# Patient Record
Sex: Female | Born: 1965 | Race: Black or African American | Hispanic: No | Marital: Married
Health system: Midwestern US, Community
[De-identification: ages and names within clinical notes are randomized; demographics above are authoritative.]

## PROBLEM LIST (undated history)

## (undated) DIAGNOSIS — H547 Unspecified visual loss: Secondary | ICD-10-CM

## (undated) DIAGNOSIS — J45909 Unspecified asthma, uncomplicated: Secondary | ICD-10-CM

## (undated) DIAGNOSIS — I10 Essential (primary) hypertension: Secondary | ICD-10-CM

## (undated) DIAGNOSIS — Z1231 Encounter for screening mammogram for malignant neoplasm of breast: Secondary | ICD-10-CM

## (undated) DIAGNOSIS — M7542 Impingement syndrome of left shoulder: Secondary | ICD-10-CM

## (undated) DIAGNOSIS — M4800 Spinal stenosis, site unspecified: Secondary | ICD-10-CM

## (undated) DIAGNOSIS — G8918 Other acute postprocedural pain: Secondary | ICD-10-CM

## (undated) DIAGNOSIS — J4520 Mild intermittent asthma, uncomplicated: Secondary | ICD-10-CM

---

## 2009-06-15 LAB — CBC
Hematocrit: 34.2 % (ref 34.0–48.0)
Hemoglobin: 11.4 g/dL — ABNORMAL LOW (ref 11.5–15.5)
MCH: 30.9 pg (ref 26.0–35.0)
MCHC: 33.4 % (ref 32.0–34.5)
MCV: 92.4 fL (ref 80.0–99.9)
MPV: 7 fL (ref 7.0–12.0)
Platelets: 284 E9/L (ref 130–450)
RBC: 3.7 E12/L (ref 3.50–5.50)
RDW: 13.9 fL (ref 11.5–15.0)
WBC: 7.1 E9/L (ref 4.5–11.5)

## 2009-06-15 LAB — COMPREHENSIVE METABOLIC PANEL
ALT: 18 U/L (ref 10–49)
AST: 21 U/L (ref 0–33)
Albumin: 3.9 g/dL (ref 3.2–4.8)
Alkaline Phosphatase: 60 U/L (ref 45–129)
BUN: 14 mg/dL (ref 6–20)
CO2: 23 mmol/L (ref 20–31)
Calcium: 9 mg/dL (ref 8.6–10.5)
Chloride: 110 mmol/L — ABNORMAL HIGH (ref 99–109)
Creatinine: 1 mg/dL (ref 0.5–1.1)
Glucose: 86 mg/dL (ref 70–110)
Potassium: 3.5 mmol/L (ref 3.5–5.5)
Sodium: 139 mmol/L (ref 132–146)
Total Bilirubin: 0.4 mg/dL (ref 0.3–1.2)
Total Protein: 7.2 g/dL (ref 5.7–8.2)

## 2009-06-15 LAB — GFR GLOMERULAR FILTRATION RATE - HB: Gfr Calculated: >60 mL/min/{1.73_m2} (ref >=60)

## 2009-06-15 LAB — TSH: TSH: 2.057 u[IU]/mL (ref 0.350–5.000)

## 2009-08-04 MED ADMIN — leuprolide (LUPRON) injection 3.75 mg: 3.75 mg | INTRAMUSCULAR | @ 16:00:00 | NDC 00074364103

## 2009-08-04 NOTE — Progress Notes (Signed)
For Lupron #2,  No bleeding since started the lupron.  No hot flushes.    Uterus -- still about 17-18 weeks size.

## 2009-09-09 LAB — CBC WITH DIFFERENTIAL
Basophils %: 0 % (ref 0–2)
Basophils Absolute: 0.02 E9/L (ref 0.00–0.20)
Eosinophils %: 2 % (ref 0–6)
Eosinophils Absolute: 0.16 E9/L (ref 0.05–0.50)
Hematocrit: 38.6 % (ref 34.0–48.0)
Hemoglobin: 12.6 g/dL (ref 11.5–15.5)
Lymphocytes Absolute: 2.52 E9/L (ref 1.50–4.00)
Lymphocytes: 31 % (ref 20–42)
MCH: 29.7 pg (ref 26.0–35.0)
MCHC: 32.8 % (ref 32.0–34.5)
MCV: 90.7 fL (ref 80.0–99.9)
MPV: 7.3 fL (ref 7.0–12.0)
Monocytes %: 7 % (ref 2–12)
Monocytes Absolute: 0.58 E9/L (ref 0.10–0.95)
Neutrophils Absolute: 4.99 E9/L (ref 1.80–7.30)
Platelets: 342 E9/L (ref 130–450)
RBC: 4.25 E12/L (ref 3.50–5.50)
RDW: 12.9 fL (ref 11.5–15.0)
Seg Neutrophils: 60 % (ref 43–80)
WBC: 8.3 E9/L (ref 4.5–11.5)

## 2009-09-09 MED ADMIN — leuprolide (LUPRON) injection 3.75 mg: 3.75 mg | INTRAMUSCULAR | @ 16:00:00 | NDC 00074364103

## 2009-09-09 NOTE — Progress Notes (Signed)
lupron inj given pt tolerated well.

## 2009-09-09 NOTE — Progress Notes (Signed)
Here for Lupron #3.  No bleeding, taking her Iron, told to decrease it to 2 times a day.    Abdomen:  Uterus about 17 weeks size.    Imp fibroids, anemia    Plan:  Lupron #3 today, cbc today see 4 weeks and if count is up schedule surgery, TAH

## 2009-10-07 MED ADMIN — leuprolide (LUPRON) injection 3.75 mg: 3.75 mg | INTRAMUSCULAR | @ 19:00:00 | NDC 00074364103

## 2009-10-07 NOTE — Progress Notes (Signed)
lupron depot 3.75 mg given im per dr. Dorene Grebe

## 2009-10-07 NOTE — Progress Notes (Signed)
Tah scheduled for 10/26/08 at 0730 by Ruthie

## 2009-10-07 NOTE — Progress Notes (Signed)
Here for Lupron and possible surgery scheduling.  Hg 12+ last visit.    On examination, uterus remains about 17-18 weeks size.  Really need to proceed with surgery scheduling.  Discussed TAH try to preserve ovaries.    Will need medical clearence by [facility] for her hypertensin and low eyesight.    Proceed with Lupron today and schedule surgery and medical clearence.

## 2009-10-18 NOTE — Progress Notes (Signed)
For hysterectomy     Hx HTN and pseudotumor cerebri     No current problems    ROS neg    VS stable      HEENT WNL     No bruits    Heart regular    Lungs clear    abd non-tender    Extremities normal      HTN controlled    Low risk for surgery    Attending Physician Statement  I have discussed the case, including pertinent history and exam findings with the resident. I agree with the documented assessment and plan.

## 2009-10-18 NOTE — Progress Notes (Signed)
Subjective:      Patient ID: Andrea Pitts is a 44 y.o. female.    HPI Patient presents today for medical cleaarnce prior to surgery.  She is currently scheduled for hysterectomy per Dr Dorene Grebe secondary  to fibroids.  She does have a history of HTN and is currently on on several medication with which she is compliant.  She does monitor her BP at home and its is on average around 120/80's.  She also has a history of pseudotumour cerebri and is followed by her opthalmologist.  Review of Systems   Constitutional: Negative for fever and chills.   Respiratory: Negative for cough, chest tightness, shortness of breath and wheezing.    Cardiovascular: Negative for chest pain and palpitations.   Gastrointestinal: Positive for abdominal pain. Negative for nausea, vomiting and diarrhea.   Genitourinary: Positive for vaginal bleeding. Negative for dysuria, urgency and frequency.   Neurological: Negative for dizziness, syncope, weakness and numbness.       Objective:   Physical Exam   Constitutional: She is oriented to person, place, and time. She appears well-developed and well-nourished.   HENT:   Head: Normocephalic and atraumatic.   Mouth/Throat: Oropharynx is clear and moist. No oropharyngeal exudate.   Neck: Neck supple. No thyromegaly present.   Cardiovascular: Normal rate, regular rhythm, normal heart sounds and intact distal pulses.  Exam reveals no gallop and no friction rub.    No murmur heard.  Pulmonary/Chest: Effort normal and breath sounds normal. No respiratory distress. She has no wheezes. She has no rales.   Abdominal: Soft. Bowel sounds are normal. She exhibits no distension. No tenderness. She has no rebound and no guarding.   Lymphadenopathy:     She has no cervical adenopathy.   Neurological: She is alert and oriented to person, place, and time.   Skin: Skin is warm and dry.   Psychiatric: She has a normal mood and affect.       Assessment:    1.HTN  2.Uterine fibroids      Plan:    1.Continiue  current medications.  2.Low risk for surgery

## 2009-10-19 NOTE — Progress Notes (Signed)
Here for preop visit today.  TAH reviewed with patient.  Try to preserve ovaries.  Discussed the procedure at length, risks, etc.  All questions answered.  Mechanical bowel prep.    Has her medical clearance, from [facility]    Upper Pe:  WNL Except is overweight.  May need midline incision.    IMP: Mennorrhagia, fibroids    Plan:  TAH 10/26/2009

## 2009-10-20 LAB — CBC
Hematocrit: 38.9 % (ref 34.0–48.0)
Hemoglobin: 12.8 g/dL (ref 11.5–15.5)
MCH: 29.3 pg (ref 26.0–35.0)
MCHC: 33 % (ref 32.0–34.5)
MCV: 88.8 fL (ref 80.0–99.9)
MPV: 7.1 fL (ref 7.0–12.0)
Platelets: 314 E9/L (ref 130–450)
RBC: 4.39 E12/L (ref 3.50–5.50)
RDW: 13.7 fL (ref 11.5–15.0)
WBC: 7.8 E9/L (ref 4.5–11.5)

## 2009-10-20 LAB — ELECTROLYTE PANEL
CO2: 23 mmol/L (ref 20–31)
Chloride: 109 mmol/L (ref 99–109)
Potassium: 3.3 mmol/L — ABNORMAL LOW (ref 3.5–5.5)
Sodium: 138 mmol/L (ref 132–146)

## 2009-10-26 ENCOUNTER — Inpatient Hospital Stay: Admit: 2009-10-26 | Source: Home / Self Care | Attending: Obstetrics & Gynecology | Admitting: Obstetrics & Gynecology

## 2009-10-27 LAB — HEMOGLOBIN AND HEMATOCRIT
Hematocrit: 32.2 % — ABNORMAL LOW (ref 34.0–48.0)
Hematocrit: 33.3 % — ABNORMAL LOW (ref 34.0–48.0)
Hemoglobin: 10.7 g/dL — ABNORMAL LOW (ref 11.5–15.5)
Hemoglobin: 10.9 g/dL — ABNORMAL LOW (ref 11.5–15.5)

## 2009-10-28 LAB — CBC
Hematocrit: 32.7 % — ABNORMAL LOW (ref 34.0–48.0)
Hemoglobin: 10.9 g/dL — ABNORMAL LOW (ref 11.5–15.5)
MCH: 29.5 pg (ref 26.0–35.0)
MCHC: 33.3 % (ref 32.0–34.5)
MCV: 88.6 fL (ref 80.0–99.9)
MPV: 7 fL (ref 7.0–12.0)
Platelets: 268 E9/L (ref 130–450)
RBC: 3.69 E12/L (ref 3.50–5.50)
RDW: 14.1 fL (ref 11.5–15.0)
WBC: 10.9 E9/L (ref 4.5–11.5)

## 2009-10-28 LAB — BASIC METABOLIC PANEL
BUN: 10 mg/dL (ref 6–20)
CO2: 27 mmol/L (ref 20–31)
Calcium: 8.6 mg/dL (ref 8.6–10.5)
Chloride: 108 mmol/L (ref 99–109)
Creatinine: 1 mg/dL (ref 0.5–1.1)
Glucose: 79 mg/dL (ref 70–110)
Potassium: 3.7 mmol/L (ref 3.5–5.5)
Sodium: 140 mmol/L (ref 132–146)

## 2009-10-28 LAB — GFR CALCULATED: Gfr Calculated: >60 mL/min/{1.73_m2} (ref >=60)

## 2009-11-02 ENCOUNTER — Encounter

## 2009-11-02 NOTE — Telephone Encounter (Signed)
Pt post op requesting refill on Percocet

## 2009-11-04 NOTE — Progress Notes (Signed)
Post op Hysterrectomy, Bowels are fine.  Seems to be doing pretty well.    Wound, slight erythema, 1/2/ of staples removed and steri-strips place.  Rx: Cipro 500 bib, recheck Tuesday next week.    Peroxide and water to incision tid.

## 2009-11-10 NOTE — Progress Notes (Signed)
Wound healing with a small skin separation about mid area, but very clean and granulating well.  Remainder of staples removed and replaced steri-strips.  Continue peroxide and water 2-3 times a day.  RTC 1 wek.prescription for motrin 600 for dis comfort

## 2009-11-25 NOTE — Progress Notes (Signed)
Here for a wound check.  Feels well.    Wound, separation is granulating in well.  Very clean, no signs of infection.    Continue Peroxide/water to incision bid and recheck in 2 weeks

## 2009-12-09 NOTE — Progress Notes (Signed)
Wound doing well.  Nice and clean, granulating well.  Continue peroxide/water twice a day.  See 1 month for wound check and pelvic

## 2010-01-07 NOTE — Progress Notes (Signed)
Here for post op check.  Hyst 10/26/09, appears to be doing well.      Wound:Completely healed now    Pelvic:  Vault healed, bimanual clear.    RTC 1 year

## 2010-07-01 MED ORDER — ACETAZOLAMIDE 250 MG PO TABS
250 MG | ORAL_TABLET | Freq: Four times a day (QID) | ORAL | Status: DC
Start: 2010-07-01 — End: 2010-09-22

## 2010-07-01 NOTE — Progress Notes (Signed)
S: 44 year old female for follow up of asthma and HTN, pseudotumor cerebri.  Taking medications compliantly.  No HA, no blurred vision, no CP, no SOB.  Using albuterol infrequently, once per week at most, controlled.  Due for mammogram.  Declines influenza vaccine.  Follows with ophthalmology.    O: VS BP 110/70   Pulse 88   Temp(Src) 98.4 ??F (36.9 ??C) (Oral)   Resp 20   Ht 5\' 5"  (1.651 m)   Wt 298 lb (135.172 kg)   BMI 49.59 kg/m2   LMP 06/04/2009   HENT okay   Lungs CTAB, no wheezing   Heart regular, no murmur   Ext no edema, pulses intact  Impression: HTN, stable.  Asthma, controlled.    Plan: Refill current medications.  Fasting labs, lipids.  Offer immunizations, mammogram.  Has had hysterectomy and no longer gets Pap tests.  Follow up in 3 months or sooner prn.    Attending Physician Statement  I have discussed the case, including pertinent history and exam findings with the resident. I agree with the documented assessment and plan.

## 2010-07-01 NOTE — Progress Notes (Signed)
Subjective:      Patient ID: Andrea Pitts is a 44 y.o. female.    HPI This is a 44 yr old AAF is here for the follow up of HTN, Asthma, pseudotumor cerebri. She is taking medications,   HTN--She denies headache, blurred vision. She has decreased vision in both eyes from Pseudotumor cerebri. Denies chest pain, SOB, palpitations. Taking all the meds  Asthma--stable and she said just uses albuterol as needed.  C/o leaking of urine when she coughs, or sneezes, no dysuria/hematuria. Denies frequency      Review of Systems. General ROS: negative for - chills or fever  Ophthalmic ROS: negative for - blurry vision  Respiratory ROS: no cough, shortness of breath, or wheezing  Cardiovascular ROS: no chest pain or dyspnea on exertion  Genito-Urinary ROS: no dysuria, trouble voiding, or hematuria  Neurological ROS: negative for - numbness/tingling      Objective:   Physical Exam.BP 110/70   Pulse 88   Temp(Src) 98.4 ??F (36.9 ??C) (Oral)   Resp 20   Ht 5\' 5"  (1.651 m)   Wt 298 lb (135.172 kg)   BMI 49.59 kg/m2   LMP 06/04/2009   General appearance - alert, well appearing, and in no distress and oriented to person, place, and time  Eyes - pupils equal and reactive, extraocular eye movements intact  Ears - bilateral TM's and external ear canals normal  Neck - supple, no significant adenopathy  Chest - clear to auscultation, no wheezes, rales or rhonchi, symmetric air entry  Heart - normal rate, regular rhythm, normal S1, S2, no murmurs, rubs, clicks or gallops  Abdomen - soft, nontender, nondistended, no masses or organomegaly, no CVA tenderness  Musculoskeletal - no joint tenderness, deformity or swelling  Extremities - peripheral pulses normal, no pedal edema, no clubbing or cyanosis      Assessment:      HTN -stable  Asthma--stable  Stress incontinence  Health Maintainence      Plan:      Continue same meds  Ordered fasting blood work  Refilled all meds  For stress incontinence--given pamphlet with Kegel exercises. Held  his Lasix and will follow up BPs  Refused Flu shot  Will order mammogram  Doesn't need pap smear

## 2010-07-11 NOTE — Telephone Encounter (Signed)
Pt called in she needs the hydrochlorathiazide pills called back in to pharmacy in capsul form she cant cut the tablets and she is also self pay and can not afford the price difference between the 2 different forms  Call rx to [facility] on [address] pt can be reached at number provided

## 2010-07-15 LAB — CBC
HCT: 38.9 % (ref 34.0–48.0)
HGB: 13.2 g/dL (ref 11.5–15.5)
MCH: 30.6 pg (ref 26.0–35.0)
MCHC: 33.8 % (ref 32.0–34.5)
MCV: 90.5 fL (ref 80.0–99.9)
MPV: 7.1 fL (ref 7.0–12.0)
Platelets: 330 E9/L (ref 130–450)
RBC: 4.3 E12/L (ref 3.50–5.50)
RDW: 13.4 fL (ref 11.5–15.0)
WBC: 9.9 E9/L (ref 4.5–11.5)

## 2010-07-15 LAB — COMPREHENSIVE METABOLIC PANEL
ALT: 21 U/L (ref 10–49)
AST: 24 U/L (ref 0–33)
Albumin: 4.3 g/dL (ref 3.2–4.8)
Alkaline Phosphatase: 72 U/L (ref 45–129)
BUN: 19 mg/dL (ref 6–20)
Bilirubin, Total: 0.4 mg/dL (ref 0.3–1.2)
CO2: 30 mmol/L (ref 20–31)
Calcium: 9.7 mg/dL (ref 8.6–10.5)
Chloride: 109 mmol/L (ref 99–109)
Creatinine: 1 mg/dL (ref 0.5–1.1)
Glucose: 85 mg/dL (ref 70–110)
Potassium: 4.3 mmol/L (ref 3.5–5.5)
Sodium: 139 mmol/L (ref 132–146)
Total Protein: 7.8 g/dL (ref 5.7–8.2)

## 2010-07-15 LAB — TSH: TSH: 3.539 u[IU]/mL (ref 0.350–5.000)

## 2010-07-15 LAB — LIPID PANEL
Cholesterol: 212 mg/dL — ABNORMAL HIGH (ref 0–199)
HDL: 45 mg/dL (ref >40.0)
LDL Calculated: 140 mg/dL — ABNORMAL HIGH (ref 0–99)
Triglycerides: 136 mg/dL (ref 0–149)

## 2010-07-15 LAB — GFR CALCULATED: Gfr Calculated: 60 mL/min/{1.73_m2} (ref >=60)

## 2010-07-15 NOTE — Telephone Encounter (Signed)
Patient blood pressure was 110/80.

## 2010-09-21 NOTE — Telephone Encounter (Signed)
Pt scheduled with bhoopathy but no openings until beginning of feb. However she is out of meds: hctz capsules, lisinopril and diamox. Could meds be called in? Diamox goes to [facility] on [address] and the other 2 get [facility] into [facility] in [place]. Her new number is [phone] and was changed in epic under home number. Thank you.

## 2010-09-22 MED ORDER — ACETAZOLAMIDE 250 MG PO TABS
250 MG | ORAL_TABLET | Freq: Four times a day (QID) | ORAL | Status: DC
Start: 2010-09-22 — End: 2010-10-07

## 2010-09-22 MED ORDER — HYDROCHLOROTHIAZIDE 25 MG PO TABS
25 MG | ORAL_TABLET | Freq: Every day | ORAL | Status: DC
Start: 2010-09-22 — End: 2010-10-07

## 2010-09-22 MED ORDER — LISINOPRIL 10 MG PO TABS
10 MG | ORAL_TABLET | Freq: Every day | ORAL | Status: DC
Start: 2010-09-22 — End: 2010-10-07

## 2010-10-07 MED ORDER — HYDROCHLOROTHIAZIDE 12.5 MG PO CAPS
12.5 MG | ORAL_CAPSULE | Freq: Every day | ORAL | Status: DC
Start: 2010-10-07 — End: 2011-01-12

## 2010-10-07 MED ORDER — ACETAZOLAMIDE 250 MG PO TABS
250 MG | ORAL_TABLET | Freq: Four times a day (QID) | ORAL | Status: DC
Start: 2010-10-07 — End: 2011-01-12

## 2010-10-07 MED ORDER — LISINOPRIL 10 MG PO TABS
10 MG | ORAL_TABLET | Freq: Every day | ORAL | Status: DC
Start: 2010-10-07 — End: 2011-01-12

## 2010-10-07 NOTE — Progress Notes (Signed)
Subjective:      Patient ID: Andrea Pitts is a 45 y.o. female.    HPIThis is a 45 yr old AAF is here for the folow up of HTN. She denie sheadaceh, no recent vision changes, denies chest pain, SOB.  She is taking all her medications  She has H/o Pseudotumor cerebri--follow Ophtholomologist and is on Diamox  Asthma-stable  HM-Upto date for Mammogram  Had Hysterectomy for abnormal bleeding    Review of Systems.Review of Systems - General ROS: negative for - chills or fever  Ophthalmic ROS: negative for - recent changes but she has base line blurred vision secondary to Pseudotumor cerebri  Respiratory ROS: no cough, shortness of breath, or wheezing  Cardiovascular ROS: no chest pain or dyspnea on exertion  Extremities--No swelling    Objective:   Physical Exam  .BP 130/84   Pulse 78   Temp(Src) 98.3 ??F (36.8 ??C) (Oral)   Resp 16   Wt 302 lb (136.986 kg)   LMP 06/04/2009  .Physical Examination: General appearance - alert, well appearing, and in no distress and oriented to person, place, and time  Chest - clear to auscultation, no wheezes, rales or rhonchi, symmetric air entry  Heart - normal rate, regular rhythm, normal S1, S2, no murmurs, rubs, clicks or gallops  Neurological - grossly intact  Extremities - peripheral pulses normal, no pedal edema, no clubbing or cyanosis      Assessment:      HTN  Asthma  Pseudotumor cerebri  Health maintainence      Plan:      Continue the same meds  Refilled her medications  Advised to keep the appointments with Eye doctor  HM-she refused flu and TdaP shot  Follow up in 3 months

## 2010-10-07 NOTE — Progress Notes (Signed)
S: 45 year old female for follow up HTN and asthma.  Compliant with medications.  No new visual changes, no headaches.  Asthma is stable.  No recent exacerbations.  Has had mammogram.  Had Pap at [facility], s/p hyster for menorrhagia.  Sees ophthalmology with pseudotumor cerebri.    O: VS BP 130/84   Pulse 78   Temp(Src) 98.3 ??F (36.8 ??C) (Oral)   Resp 16   Wt 302 lb (136.986 kg)   LMP 06/04/2009   Heart regular   Lungs CTAB   Abd soft, nontender, obese    MS intact, no edema  Impression: HTN, asthma, controlled  Plan: Continue same medications, refills provided.  Patient declines immunizations.  Follow up with ophthalmology.  RTO 3 months or sooner prn.    Attending Physician Statement  I have discussed the case, including pertinent history and exam findings with the resident. I agree with the documented assessment and plan.

## 2011-01-12 MED ORDER — IBUPROFEN 800 MG PO TABS
800 MG | ORAL_TABLET | Freq: Three times a day (TID) | ORAL | Status: DC | PRN
Start: 2011-01-12 — End: 2012-01-07

## 2011-01-12 MED ORDER — LISINOPRIL 10 MG PO TABS
10 MG | ORAL_TABLET | Freq: Every day | ORAL | Status: DC
Start: 2011-01-12 — End: 2011-04-05

## 2011-01-12 MED ORDER — HYDROCHLOROTHIAZIDE 12.5 MG PO CAPS
12.5 MG | ORAL_CAPSULE | Freq: Every day | ORAL | Status: DC
Start: 2011-01-12 — End: 2011-04-05

## 2011-01-12 MED ORDER — ACETAZOLAMIDE 250 MG PO TABS
250 MG | ORAL_TABLET | Freq: Four times a day (QID) | ORAL | Status: DC
Start: 2011-01-12 — End: 2011-04-11

## 2011-01-12 NOTE — Progress Notes (Signed)
S: this is a 45 yr old AAF is here for the b/l knee pain, right shoulder pain for 2 months  Knee pain--intermittent, sharp, worsens on exertion, 5/10 intensity, sometimes worsens even on rest, disturbing sleep, no weakness, numbness/tngling.   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  H/o Pseudotumor cerebri, has visual  loss from it.    O: .BP 124/78   Pulse 80   Temp(Src) 98.6 ??F (37 ??C) (Oral)   Resp 20   Ht 5\' 6"  (1.676 m)   Wt 304 lb (137.893 kg)   BMI 49.07 kg/m2   LMP 06/04/2009  .Physical Examination:   General appearance - alert, well appearing,obese and in no distress and oriented to person, place, and time  Neck - supple, no significant adenopathy  Chest - clear to auscultation, no wheezes, rales or rhonchi, symmetric air entry  Heart - normal rate, regular rhythm, normal S1, S2, no murmurs, rubs, clicks or gallops  Abdomen - soft, nontender, nondistended, no masses or organomegaly  Musculoskeletal -B/L  knee-crepitus, ROM is normal, DTR normal,no swelling, Anterior and posterior drawer tests are normal  Right shoulder- tenderness in joint line, ROM is decreased ,  Strength is normal. Sensations intact.   Extremities - peripheral pulses normal, no pedal edema, no clubbing or cyanosis    A/P    1. B/l knee pain   X-ray of knees   Ibuprofen as needed   Encouraged heating pad   Referred to PT  2. Right shoulder pain   X-ray of shoulder   Supportive treatment   Referral to PT   Consider injection in future visit  .3. Obesity   Advised lifestyle changes about healthy diet and exercise

## 2011-01-12 NOTE — Progress Notes (Signed)
S: 45 y.o. female with knee pain, bilateral, for about 2 months.  Right shoulder pain.  Sore, spasms at times in knees.  Pain awakes her at night.  Intermittent, 5/10, sometimes worse with activity.  No numbness or weakness.  No tingling.  Right shoulder joint sore, bothers her in bed.  History of pseudotumor cerebri, chronic visual problems, nothing new.   O: VS: BP 124/78   Pulse 80   Temp(Src) 98.6 ??F (37 ??C) (Oral)   Resp 20   Ht 5\' 6"  (1.676 m)   Wt 304 lb (137.893 kg)   BMI 49.07 kg/m2   LMP 06/04/2009   General: NAD, appropriate affect and grooming   Right shoulder tenderness along joint, ROM decreased, no weakness, no atrophy.  MS intact distally, sensation intact.     Knees with crepitus, extension and flexion normal ROM.  No laxity.     CV:  RRR, no gallops, rubs, or murmurs   Resp: CTAB   Ext:  No edema  Impression: Bilateral knee pain, right shoulder pain  Plan: X-rays bilateral knee, weight bearing views.  Shoulder x-rays, PT.  NSAIDs.  Counsel on lifestyle interventions, healthy diet and exercise.  RTO 4-6 weeks or sooner prn. Consider injection in the future if no better.     Attending Physician Statement  I have discussed the case, including pertinent history and exam findings with the resident. I agree with the documented assessment and plan.

## 2011-01-19 NOTE — Progress Notes (Signed)
[facility]                Phone: [phone]   Fax: [phone]    Physical Therapy Daily Treatment Note  Date:  01/19/2011    Patient Name:  Andrea Pitts    DOB:  1966-07-01  MRN: [id number]    Restrictions/Precautions:    Diagnosis:  B knee pain/R shoulder pain  Insurance/Certification information:  Medicare  Referring Physician:  Bhoopathy  Plan of care signed (Y/N):    Visit# / total visits:  1/10  Pain level: 7/10   Therapist: Chrystine Oiler, DPT     Subjective:      Exercises:  Exercise/Equipment Resistance/Repetitions Other comments   UBE              Pulley flex                 abd            Table str flex                      abd                    ER            Heel slides                  SAQ       SLR              Mini squat               Step ups                         Other Therapeutic Activities:      Home Exercise Program:      Manual Treatments:      Modalities:      Timed Code Treatment Minutes:      Total Treatment Minutes:      Treatment/Activity Tolerance:  [x]  Patient tolerated treatment well []  Patient limited by fatique  []  Patient limited by pain  []  Patient limited by other medical complications  []  Other:     Prognosis: [x]  Good []  Fair  []  Poor    Patient Requires Follow-up: [x]  Yes  []  No    Plan:   []  Continue per plan of care []  Alter current plan (see comments)  [x]  Plan of care initiated []  Hold pending MD visit []  Discharge  Plan for Next Session:      See Weekly Progress Note: [x]   Yes  []   No  Next due:        Electronically signed by:  Myer Peer, DPT

## 2011-01-19 NOTE — Progress Notes (Signed)
Physical Therapy  Initial Assessment  Date: 01/19/2011  Patient Name: Andrea Pitts  MRN: [id number]  DOB: Aug 08, 1966          Restrictions       Subjective   General  Chart Reviewed: Yes  Referring Practitioner: Dr. Nicola Police   Diagnosis: R shoulder pain; B knee pain   Follows Commands: Within Functional Limits  PT Visit Information  PT Insurance Information: Medicare   Total # of Visits Approved: 10   Total # of Visits to Date: 1   Subjective  Subjective: Patient presents to PT with c/o B knee pain/R shoulder pain that began a few months ago. Patient reports that there was no injury associated with pain. Patient reports pain can hinder functional level   Pain Screening  Patient Currently in Pain: Yes  Pain Assessment  Pain Assessment: 0-10  Pain Level: 7  Pain Type: Chronic pain  Pain Location: Shoulder;Knee  Pain Descriptors: Lambert Mody;Stabbing  Pain Frequency: Intermittent  Pain Intervention(s): Heat applied;Medication (see eMar)  Vision/Hearing     Home Living     Orientation     Objective        AROM RLE (degrees)  RLE AROM: Exceptions  R Knee Flexion 0-145: 105*  AROM LLE (degrees)  LLE AROM: Exceptions  L Knee Flexion 0-145: 100*  AROM RUE (degrees)  RUE AROM: Exceptions  R Shoulder ABduction 0-180: 110*  Strength RLE  Comment: grossly 4/5 R knee  Strength LLE  Comment: grossly 4/5 L knee  Strength RUE  Comment: grossly 4/5 R shoulder   Strength LUE  Strength LUE: WFL                 Ambulation  Ambulation?: Yes  Ambulation 1  Surface: level tile  Device: No Device (hand held assist due to decreased vision )  Quality of Gait: good gait mechanics with no sign of distress         Assessment   Assessment  Assessment: Decreased ROM;Decreased strength  Prognosis: Good  Requires PT Follow Up: Yes  Time In: 1335  Time Out: 1430  Activity Tolerance  Activity Tolerance: Patient Tolerated treatment well  Plan  Plan: Plan of care initiated       Plan   Plan  Plan: Plan of care initiated  Progress Note  See Progress Note:  Yes  Frequency and duration of tx  Days: 2  Weeks: 5    Goals  Short term goals  Time Frame for Short term goals: 3 weeks   Short term goal 1: Increase ROM of B knees to > 105* flexion  Short term goal 2: Increase ROM of R shoulder abduction to > 130*   Short term goal 3: Increase strength of R shoulder/B knees to grossly 4+/5 all planes   Short term goal 4: Decrease pain to < 5/10 across B knees/R shoulder with activitiy   Long term goals  Time Frame for Long term goals : 5 weeks   Long term goal 1: Increase ROM of B knees to > 110* flexion  Long term goal 2: Increase ROM of R shoulder abduction to > 150*   Long term goal 3: Increase strength of R shoulder/B knees to grossly 5/5 all planes   Long term goal 4: Decrease pain to < 3/10 across B knees/R shoulder with activitiy   Long term goal 5: Patient demonstrates independence with HEP        Myer Peer, DPT   License and Documentation Cosign  Therapy License Number: [facility] [id number]

## 2011-01-23 NOTE — Progress Notes (Signed)
[facility]                Phone: [phone]   Fax: [phone]    Physical Therapy Daily Treatment Note  Date:  01/23/2011    Patient Name:  Andrea Pitts    DOB:  1966/07/22  MRN: [id number]    Restrictions/Precautions:    Diagnosis:  B knee pain/R shoulder pain  Insurance/Certification information:  Medicare  Referring Physician:  Bhoopathy  Plan of care signed (Y/N):    Visit# / total visits:  2/10  Pain level: 7/10   Therapist: Chrystine Oiler, DPT     Subjective:      Exercises:  Exercise/Equipment Resistance/Repetitions Other comments   UBE   x90mins           Pulley flex   x23mins              abd x57mins           Table str flex   4x20s                   abd 4x20s                   ER 4x20s           Heel slides      x15ea            SAQ   2x15    SLR   nt           Mini squat    nt           Step ups    nt                     Other Therapeutic Activities:      Home Exercise Program:      Manual Treatments:      Modalities:      Timed Code Treatment Minutes:      Total Treatment Minutes:      Treatment/Activity Tolerance:  [x]  Patient tolerated treatment well []  Patient limited by fatique  []  Patient limited by pain  []  Patient limited by other medical complications  []  Other:     Prognosis: [x]  Good []  Fair  []  Poor    Patient Requires Follow-up: [x]  Yes  []  No    Plan:   []  Continue per plan of care []  Alter current plan (see comments)  [x]  Plan of care initiated []  Hold pending MD visit []  Discharge  Plan for Next Session:      See Weekly Progress Note: [x]   Yes  []   No  Next due:        Electronically signed by:  Myer Peer, DPT

## 2011-01-25 NOTE — Progress Notes (Signed)
[facility]                Phone: [phone]   Fax: [phone]    Physical Therapy Daily Treatment Note  Date:  01/25/2011    Patient Name:  Andrea Pitts    DOB:  [birth year]  MRN: [id number]    Restrictions/Precautions:    Diagnosis:  B knee pain/R shoulder pain  Insurance/Certification information:  Medicare  Referring Physician:  Bhoopathy  Plan of care signed (Y/N):    Visit# / total visits:  3/10  Pain level: 7/10 R shoulder; 5/10 L knee   Therapist: Chrystine Oiler, DPT     Subjective:      Exercises:  Exercise/Equipment Resistance/Repetitions Other comments   UBE   x59mins           Pulley flex   x76mins              abd x17mins           Table str flex   4x20s                   abd 4x20s                   ER 4x20s           Heel slides      x15ea            SAQ   2x15    SLR   nt           Mini squat    nt           Step ups    nt                     Other Therapeutic Activities:      Home Exercise Program:      Manual Treatments:      Modalities:      Timed Code Treatment Minutes:      Total Treatment Minutes:      Treatment/Activity Tolerance:  [x]  Patient tolerated treatment well []  Patient limited by fatique  []  Patient limited by pain  []  Patient limited by other medical complications  []  Other:     Prognosis: [x]  Good []  Fair  []  Poor    Patient Requires Follow-up: [x]  Yes  []  No    Plan:   []  Continue per plan of care []  Alter current plan (see comments)  [x]  Plan of care initiated []  Hold pending MD visit []  Discharge  Plan for Next Session:      See Weekly Progress Note: [x]   Yes  []   No  Next due:        Electronically signed by:  Myer Peer, DPT

## 2011-01-25 NOTE — Progress Notes (Signed)
ST. [facility]                Phone: [phone]   Fax: [phone]    Physical Therapy  Weekly Progress Note  Date:  01/26/2011    Patient Name:  Andrea Pitts    DOB:  May 21, 1966  Restrictions/Precautions:    Diagnosis:    Treatment Diagnosis:    Insurance/Certification information:    Referring Physician:    Plan of care signed (Y/N):    Visit# / total visits:  /  Pain level: /10      Subjective:    Patient presented to two of two treatment sessions this week ending in 01-27-11. Patient reports having cont pain of 7/10 across R shoulder and 5/10 across L knee. Patient reports having pain across the R knee however states that it is less than the L knee. She reports that her initial treatment session was very painful therefore she took pain medication prior to her second appointment.      Objective:  Observation: Cont with there ex as per flow sheet to increase ROM/strength of R shoulder and B knees and to decrease pain response. AROM of B knees measured to 105* flexion/0* extension. Strength measured to grossly 4/5 all planes. AAROM of L shoulder is [facility] for flexion/abduction.   Test measurements:       Assessment:  Summary: Patient tolerated treatment well. ROM of L knee has increased slightly with increased ROM noted in R shoulder. Pain remains across all regions with no relief reported at this time. PT assists patient to each ex due to patient having visual impairments. Pt and Pt mother instructed that patient to perform table str for shoulder ROM and heel slides for knee ROM.      Plan:  ?? Plan: Cont with PT POC increase ROM/strength of  Bknees and L shoulder.     Electronically Signed by: Myer Peer, DPT

## 2011-02-01 NOTE — Progress Notes (Signed)
[facility]. [facility]                Phone: [phone]  Fax: [phone]    Physical Therapy  Cancellation/No-show Note  Patient Name:  Andrea Pitts  DOB:  1966/06/18   Date:  02/01/2011    For today's appointment patient:  [x]   Cancelled  []   Rescheduled appointment  []   No-show     Reason given by patient:  []   Patient ill  []   Conflicting appointment  []   No transportation    []   Conflict with work  []   No reason given  []   Other:     Comments:      Electronically signed by:  Myer Peer, DPT

## 2011-02-06 NOTE — Progress Notes (Signed)
[facility]. [facility]                Phone: [phone]   Fax: [phone]    Physical Therapy Daily Treatment Note  Date:  02/06/2011    Patient Name:  Andrea Pitts    DOB:  Apr 20, 1966  MRN: [id number]    Restrictions/Precautions:    Diagnosis:  B knee pain/R shoulder pain  Insurance/Certification information:  Medicare  Referring Physician:  Bhoopathy  Plan of care signed (Y/N):    Visit# / total visits:  4/10  Pain level: 7/10 R shoulder; 5/10 L knee   Therapist: Chrystine Oiler, DPT     Subjective:      Exercises:  Exercise/Equipment Resistance/Repetitions Other comments   UBE   x60mins           Pulley flex   x40mins              abd x68mins           Table str flex   4x20s                   abd 4x20s                   ER 4x20s           Heel slides      x15ea            SAQ   2x15    SLR   nt           Mini squat    nt           Step ups    nt                     Other Therapeutic Activities:      Home Exercise Program:      Manual Treatments:      Modalities:      Timed Code Treatment Minutes:      Total Treatment Minutes:      Treatment/Activity Tolerance:  [x]  Patient tolerated treatment well []  Patient limited by fatique  []  Patient limited by pain  []  Patient limited by other medical complications  []  Other:     Prognosis: [x]  Good []  Fair  []  Poor    Patient Requires Follow-up: [x]  Yes  []  No    Plan:   [x]  Continue per plan of care []  Alter current plan (see comments)  []  Plan of care initiated []  Hold pending MD visit []  Discharge  Plan for Next Session:      See Weekly Progress Note: []   Yes  []   No  Next due:        Electronically signed by:  Myer Peer, DPT

## 2011-02-08 NOTE — Progress Notes (Signed)
ST. [facility]                Phone: [phone]   Fax: [phone]    Physical Therapy  Weekly Progress Note  Date:  02/08/2011    Patient Name:  Andrea Pitts    DOB:  [birth year]  Restrictions/Precautions:    Diagnosis:    Treatment Diagnosis:    Insurance/Certification information:    Referring Physician:    Plan of care signed (Y/N):    Visit# / total visits:  /  Pain level: /10      Subjective:    Patient presented to two of two treatment sessions this week ending in 01-27-11. Patient reports having cont pain of 7/10 across R shoulder and 4/10 across L knee. Patient reports that the shoulder pain cont to be bothersome. She reports feeling tired today.      Objective:  Observation: Cont with there ex as per flow sheet to increase ROM/strength of R shoulder and B knees and to decrease pain response. AROM of B knees measured to 105* flexion/0* extension. Strength measured to grossly 4/5 all planes. AAROM of L shoulder is [facility] for flexion/abduction.   Test measurements:       Assessment:  Summary: Patient tolerated treatment well. ROM of B knees remain unchanged with ROM WFL with R shoulder AAROM. Pain remains across all regions with no relief reported at this time. PT assists patient to each ex due to patient having visual impairments. Pt and Pt mother instructed that patient to perform table str for shoulder ROM and heel slides for knee ROM.      Plan:  ?? Plan: Cont with PT POC increase ROM/strength of  Bknees and L shoulder.     Electronically Signed by: Myer Peer, DPT

## 2011-02-08 NOTE — Progress Notes (Signed)
ST. [facility]                Phone: [phone]   Fax: [phone]    Physical Therapy Daily Treatment Note  Date:  02/08/2011    Patient Name:  Andrea Pitts    DOB:  Dec 04, 1965  MRN: [id number]    Restrictions/Precautions:    Diagnosis:  B knee pain/R shoulder pain  Insurance/Certification information:  Medicare  Referring Physician:  Bhoopathy  Plan of care signed (Y/N):    Visit# / total visits:  5/10  Pain level: 7/10 R shoulder; 5/10 L knee   Therapist: Chrystine Oiler, DPT     Subjective:      Exercises:  Exercise/Equipment Resistance/Repetitions Other comments   UBE   x12mins           Pulley flex   x88mins              abd x72mins           Table str flex   4x20s                   abd 4x20s                   ER 4x20s           Heel slides      x15ea            SAQ   2x15    SLR   nt           Mini squat    nt           Step ups    nt                     Other Therapeutic Activities:      Home Exercise Program:      Manual Treatments:      Modalities:      Timed Code Treatment Minutes:      Total Treatment Minutes:      Treatment/Activity Tolerance:  [x]  Patient tolerated treatment well []  Patient limited by fatique  []  Patient limited by pain  []  Patient limited by other medical complications  []  Other:     Prognosis: [x]  Good []  Fair  []  Poor    Patient Requires Follow-up: [x]  Yes  []  No    Plan:   [x]  Continue per plan of care []  Alter current plan (see comments)  []  Plan of care initiated []  Hold pending MD visit []  Discharge  Plan for Next Session:      See Weekly Progress Note: []   Yes  []   No  Next due:        Electronically signed by:  Myer Peer, DPT

## 2011-02-13 NOTE — Progress Notes (Signed)
ST. [facility]                Phone: [phone]   Fax: [phone]    Physical Therapy Daily Treatment Note  Date:  02/13/2011    Patient Name:  Andrea Pitts    DOB:  May 31, 1966  MRN: [id number]    Restrictions/Precautions:    Diagnosis:  B knee pain/R shoulder pain  Insurance/Certification information:  Medicare  Referring Physician:  Bhoopathy  Plan of care signed (Y/N):    Visit# / total visits:  6/10  Pain level: 7/10 R shoulder; 5/10 L knee   Therapist: Chrystine Oiler, DPT     Subjective:      Exercises:  Exercise/Equipment Resistance/Repetitions Other comments   UBE   x28mins           Pulley flex   x42mins              abd x33mins           Table str flex   4x20s                   abd 4x20s                   ER 4x20s           Heel slides      x15ea            SAQ   2x15    SLR   nt           Mini squat    nt           Step ups    nt                     Other Therapeutic Activities:      Home Exercise Program:      Manual Treatments:      Modalities:      Timed Code Treatment Minutes:      Total Treatment Minutes:      Treatment/Activity Tolerance:  [x]  Patient tolerated treatment well []  Patient limited by fatique  []  Patient limited by pain  []  Patient limited by other medical complications  []  Other:     Prognosis: [x]  Good []  Fair  []  Poor    Patient Requires Follow-up: [x]  Yes  []  No    Plan:   [x]  Continue per plan of care []  Alter current plan (see comments)  []  Plan of care initiated []  Hold pending MD visit []  Discharge  Plan for Next Session:      See Weekly Progress Note: []   Yes  []   No  Next due:        Electronically signed by:  Myer Peer, DPT

## 2011-02-15 NOTE — Progress Notes (Signed)
ST. [facility]                Phone: [phone]   Fax: [phone]    Physical Therapy  Weekly Progress Note  Date:  02/15/2011    Patient Name:  Andrea Pitts    DOB:  April 24, 1966  Restrictions/Precautions:    Diagnosis:    Treatment Diagnosis:    Insurance/Certification information:    Referring Physician:    Plan of care signed (Y/N):    Visit# / total visits:  /  Pain level: /10      Subjective:    Patient presented to two of two treatment sessions this week ending in 02-17-11. Patient reports having cont pain of 8/10 across R shoulder and 4/10 across B knees. Patient reports that although she cont to feel a lot better than she did on initial eval the shoulder remains in pain.       Objective:  Observation: Cont with there ex as per flow sheet followed by MH to R shoulder and B knees to increase ROM/strength of R shoulder and B knees and to decrease pain response. AROM of R knee measured to 105* flexion/0* extension; L knee 110* flexion and 0* extension. Strength measured to grossly 4/5 all planes. AAROM of L shoulder is [facility] for flexion/abduction.   Test measurements:       Assessment:  Summary: Patient tolerated treatment well. ROM of L knee has increased however R knee ROM remains unchanged. AAROM of R shoulder remains WFL. Pain remains across R shoulder with decreased pain noted to B knees. PT assists patient to each ex due to patient having visual impairments. Pt instructed to cont with HEP daily.       Plan:  ?? Plan: Cont with PT POC increase ROM/strength of  Bknees and L shoulder.     Electronically Signed by: Myer Peer, DPT

## 2011-02-15 NOTE — Progress Notes (Signed)
ST. [facility]                Phone: [phone]   Fax: [phone]    Physical Therapy Daily Treatment Note  Date:  02/15/2011    Patient Name:  Andrea Pitts    DOB:  1966/06/30  MRN: [id number]    Restrictions/Precautions:    Diagnosis:  B knee pain/R shoulder pain  Insurance/Certification information:  Medicare  Referring Physician:  Bhoopathy  Plan of care signed (Y/N):    Visit# / total visits:  7/10  Pain level: 8/10 R shoulder; 4/10 B knee   Therapist: Chrystine Oiler, DPT     Subjective:      Exercises:  Exercise/Equipment Resistance/Repetitions Other comments   UBE   x9mins           Pulley flex   x45mins              abd x52mins           Table str flex   4x20s                   abd 4x20s                   ER 4x20s           Heel slides      x15ea            SAQ   2x15    SLR   nt           Mini squat    nt           Step ups    nt             MH  x99mins (Bknees/R shoulder)      Other Therapeutic Activities:      Home Exercise Program:      Manual Treatments:      Modalities:      Timed Code Treatment Minutes:      Total Treatment Minutes:      Treatment/Activity Tolerance:  [x]  Patient tolerated treatment well []  Patient limited by fatique  []  Patient limited by pain  []  Patient limited by other medical complications  []  Other:     Prognosis: [x]  Good []  Fair  []  Poor    Patient Requires Follow-up: [x]  Yes  []  No    Plan:   [x]  Continue per plan of care []  Alter current plan (see comments)  []  Plan of care initiated []  Hold pending MD visit []  Discharge  Plan for Next Session:      See Weekly Progress Note: []   Yes  []   No  Next due:        Electronically signed by:  Myer Peer, DPT

## 2011-02-20 NOTE — Progress Notes (Signed)
ST. [facility]                Phone: [phone]   Fax: [phone]    Physical Therapy Daily Treatment Note  Date:  02/20/2011    Patient Name:  Andrea Pitts    DOB:  [birth year]  MRN: [id number]    Restrictions/Precautions:    Diagnosis:  B knee pain/R shoulder pain  Insurance/Certification information:  Medicare  Referring Physician:  Bhoopathy  Plan of care signed (Y/N):    Visit# / total visits:  8/10  Pain level: 8/10 R shoulder; 4/10 B knee   Therapist: Chrystine Oiler, DPT     Subjective:      Exercises:  Exercise/Equipment Resistance/Repetitions Other comments   UBE   x65mins           Pulley flex   x74mins              abd x27mins           Table str flex   4x20s                   abd 4x20s                   ER 4x20s           Heel slides      x15ea            SAQ   2x15    SLR   nt           Mini squat    nt           Step ups    nt             MH  x1mins (Bknees/R shoulder)      Other Therapeutic Activities:      Home Exercise Program:      Manual Treatments:      Modalities:  MH to (B) knees x prior to session; MH right shoulder x20min post session    Timed Code Treatment Minutes:      Total Treatment Minutes:      Treatment/Activity Tolerance:  [x]  Patient tolerated treatment well []  Patient limited by fatique  []  Patient limited by pain  []  Patient limited by other medical complications  []  Other:     Prognosis: [x]  Good []  Fair  []  Poor    Patient Requires Follow-up: [x]  Yes  []  No    Plan:   [x]  Continue per plan of care []  Alter current plan (see comments)  []  Plan of care initiated []  Hold pending MD visit []  Discharge  Plan for Next Session:      See Weekly Progress Note: []   Yes  [x]   No  Next due:        Electronically signed by:  Remus Blake, PTA [phone]

## 2011-02-22 NOTE — Progress Notes (Signed)
ST. [facility]                Phone: [phone]   Fax: [phone]    Physical Therapy  Weekly Progress Note  Date:  02/23/2011    Patient Name:  Andrea Pitts    DOB:  24-Oct-1965  Restrictions/Precautions:    Diagnosis:    Treatment Diagnosis:    Insurance/Certification information:    Referring Physician:    Plan of care signed (Y/N):    Visit# / total visits:  /  Pain level: /10      Subjective:    Patient presented to two of two treatment sessions this week ending in 02-24-11. Patient reports having cont pain of 5/10 across R shoulder and 0/10 across B knees. Patient reports that her knees have been feeling good lately however the shoulder cont to ache.      Objective:  Observation: Cont with there ex as per flow sheet followed by MH to R shoulder and B knees to increase ROM/strength of R shoulder and B knees and to decrease pain response. AROM of R knee measured to 105* flexion/0* extension; L knee 110* flexion and 0* extension. Strength measured to grossly 4/5 all planes. AAROM of L shoulder is [facility] for flexion/abduction.   Test measurements:       Assessment:  Summary: Patient tolerated treatment well. ROM of B knees remains unchanged from last measurements.  AAROM of R shoulder remains WFL. Pain has subsided across B knees making ambulation stable with good gait mechanics. R shoulder pain remains however the pain cont to decrease with therapy.  PT assists patient to each ex due to patient having visual impairments. Pt instructed to cont with HEP daily.       Plan:  ?? Plan: Cont with PT POC increase ROM/strength of  Bknees and L shoulder.     Electronically Signed by: Myer Peer, DPT

## 2011-02-23 NOTE — Progress Notes (Signed)
ST. [facility]                Phone: [phone]   Fax: [phone]    Physical Therapy Daily Treatment Note  Date:  02/23/2011    Patient Name:  Andrea Pitts    DOB:  Nov 12, 1965  MRN: [id number]    Restrictions/Precautions:    Diagnosis:  B knee pain/R shoulder pain  Insurance/Certification information:  Medicare  Referring Physician:  Bhoopathy  Plan of care signed (Y/N):    Visit# / total visits:  9/10  Pain level: 5/10 R shoulder; 0/10 B knee   Therapist: Chrystine Oiler, DPT     Subjective:      Exercises:  Exercise/Equipment Resistance/Repetitions Other comments   UBE   x67mins           Pulley flex   x27mins              abd x88mins           Table str flex   4x20s                   abd 4x20s                   ER 4x20s           Heel slides      x15ea            SAQ   2x15    SLR   nt           Mini squat    nt           Step ups    nt             MH  x81mins (Bknees/R shoulder)      Other Therapeutic Activities:      Home Exercise Program:      Manual Treatments:      Modalities:  MH to (B) knees x prior to session; MH right shoulder x7min post session    Timed Code Treatment Minutes:      Total Treatment Minutes:      Treatment/Activity Tolerance:  [x]  Patient tolerated treatment well []  Patient limited by fatique  []  Patient limited by pain  []  Patient limited by other medical complications  []  Other:     Prognosis: [x]  Good []  Fair  []  Poor    Patient Requires Follow-up: [x]  Yes  []  No    Plan:   [x]  Continue per plan of care []  Alter current plan (see comments)  []  Plan of care initiated []  Hold pending MD visit []  Discharge  Plan for Next Session:      See Weekly Progress Note: []   Yes  []   No  Next due:        Electronically signed by:  Myer Peer, PT

## 2011-02-27 NOTE — Progress Notes (Signed)
ST. [facility]                Phone: [phone]   Fax: [phone]    Physical Therapy Daily Treatment Note  Date:  02/27/2011    Patient Name:  Andrea Pitts    DOB:  Feb 09, 1966  MRN: [id number]    Restrictions/Precautions:    Diagnosis:  B knee pain/R shoulder pain  Insurance/Certification information:  Medicare  Referring Physician:  Bhoopathy  Plan of care signed (Y/N):    Visit# / total visits:  10/10  Pain level: 4/10 R shoulder; 0/10 B knees   Therapist: Chrystine Oiler, DPT     Subjective:  Patient reported slight pain (B) knees following exer    Objective:  AROM right knee 0 - 120*; left knee 0 - 125*.  Strength (B) LEs: knee flex/ext 5/5; (B) hip flex 4+/5    Exercises:  Exercise/Equipment Resistance/Repetitions Other comments   UBE   x71mins           Pulley flex   x58mins              abd x43mins           Table str flex   4x20s                   abd 4x20s                   ER 4x20s           Heel slides      x15ea            SAQ   2x15    SLR   nt           Mini squat    nt           Step ups    nt             MH  x7mins (Bknees/R shoulder)      Other Therapeutic Activities:      Home Exercise Program:  Provided with GTB/BTB/exer instruction for shoulder 02/27/11    Manual Treatments:      Modalities:  MH to (B) knees x prior to session; MH right shoulder x64min post session    Timed Code Treatment Minutes:      Total Treatment Minutes:      Treatment/Activity Tolerance:  [x]  Patient tolerated treatment well []  Patient limited by fatique  []  Patient limited by pain  []  Patient limited by other medical complications  []  Other:     Prognosis: [x]  Good []  Fair  []  Poor    Patient Requires Follow-up: [x]  Yes  []  No    Plan:   []  Continue per plan of care []  Alter current plan (see comments)  []  Plan of care initiated []  Hold pending MD visit [x]  Discharge  Plan for Next Session:      See Weekly Progress Note: []   Yes  []   No  Next due:        Electronically signed by:   Remus Blake, PTA [phone]

## 2011-04-04 NOTE — Progress Notes (Signed)
ST. [facility]                Phone: [phone]   Fax: [phone]    To: Dr. Nicola Police      From: Amedeo Plenty     Patient: Andrea Pitts       DOB: 1966-03-14  Diagnosis:  B knee pain/R shoulder pain    Date: 04/04/2011  Treatment Diagnosis:      Physical Therapy Progress/Discharge Note    Total Visits to date:  10/10  Cancels/No-shows to date:      Plan of Care/Treatment to date:  [x]  Therapeutic Exercise    []  Modalities:  [x]  Therapeutic Activity     []  Ultrasound  []  Electrical Stimulation  []  Gait Training      []  Cervical Traction   []  Lumbar Traction  []  Neuromuscular Re-education  [x]  Cold/hotpack []  Iontophoresis  [x]  Instruction in HEP      Other:  []  Manual Therapy       []     []  Aquatic Therapy       []                     ?      Significant Findings At Last Visit/Comments:    Patient presented to PT for 10/10 therapy sessions. At last session, pt cont to have pain of 4/10 across R shoulder region and 0/10 across B knees. AROM right knee 0 - 120*; left knee 0 - 125*. Strength (B) LEs: knee flex/ext 5/5; (B) hip flex 4+/5. With pain subsiding across B knees, ambulation has become stable with good gait mechanics.  Patient demonstrates ability to perform HEP independently. Patient instructed to cont with HEP 1-2x/day.      Frequency/Duration:  # Days per week: []  1 day # Weeks: []  1 week []  4 weeks      [x]  2 days?   []  2 weeks [x]  5 weeks      []  3 days   []  3 weeks []  6 weeks     Rehab Potential: []  Excellent [x]  Good []  Fair  []  Poor     Goal Status:  []  Achieved [x]  Partially Achieved  []  Not Achieved     Patient Status: []  Continue per initial plan of Care     [x]  Patient now discharged     []  Additional visits requested, Please re-certify for additional visits:      Requested frequency/duration:  X/week for weeks    Electronically signed by:  Myer Peer ,DPT    If you have any questions or concerns, please don't hesitate to call.  Thank you for your  referral.

## 2011-04-05 NOTE — Telephone Encounter (Signed)
Thanks

## 2011-04-05 NOTE — Telephone Encounter (Signed)
Patient does have appt with Opara on 8/17

## 2011-04-06 MED ORDER — LISINOPRIL 10 MG PO TABS
10 MG | ORAL_TABLET | Freq: Every day | ORAL | Status: DC
Start: 2011-04-06 — End: 2011-11-27

## 2011-04-06 MED ORDER — HYDROCHLOROTHIAZIDE 12.5 MG PO CAPS
12.5 MG | ORAL_CAPSULE | Freq: Every day | ORAL | Status: DC
Start: 2011-04-06 — End: 2011-11-27

## 2011-04-11 MED ORDER — ACETAZOLAMIDE 250 MG PO TABS
250 MG | ORAL_TABLET | Freq: Four times a day (QID) | ORAL | Status: DC
Start: 2011-04-11 — End: 2011-11-27

## 2011-04-21 NOTE — Telephone Encounter (Signed)
No show

## 2011-11-27 MED ORDER — HYDROCHLOROTHIAZIDE 12.5 MG PO CAPS
12.5 MG | ORAL_CAPSULE | Freq: Every day | ORAL | Status: DC
Start: 2011-11-27 — End: 2012-12-23

## 2011-11-27 MED ORDER — ACETAZOLAMIDE 250 MG PO TABS
250 MG | ORAL_TABLET | Freq: Four times a day (QID) | ORAL | Status: DC
Start: 2011-11-27 — End: 2012-10-07

## 2011-11-27 MED ORDER — LISINOPRIL 10 MG PO TABS
10 MG | ORAL_TABLET | Freq: Every day | ORAL | Status: DC
Start: 2011-11-27 — End: 2012-12-23

## 2011-11-27 NOTE — Progress Notes (Signed)
Hx of HTN, pseudotumor cerebri.  Not seeing specialists.  Is on diuretics.  Felt to not need surgical intervention per patient.  No CP or SOB  Burning sensation on both feet for several months.  No known DM.  No weakness, numbness or involvement of hands  BP 140/86  Pulse 82  Temp(Src) 98.3 F (36.8 C) (Oral)  Resp 16  Ht 5\' 4"  (1.626 m)  Wt 307 lb (139.254 kg)  BMI 52.67 kg/m2  LMP 06/04/2009  Recheck 120/80 by residents  CV: S1S2 RRR no murmur  Resp CTAB no wheeze  GI soft NT/ND +bs  Extr no edema  Neuro Intact sensation of both feet  Imp HTN    Home meds refilled   Paresthesia    Labs       RTO in 1 mo/prn  Attending Physician Statement  I have discussed the case, including pertinent history and exam findings with the resident. I agree with the documented assessment and plan.

## 2011-11-27 NOTE — Patient Instructions (Addendum)
Numbness and Tingling: After Your Visit  Your Care Instructions  Many things can cause numbness or tingling. Swelling may put pressure on a nerve. This could cause you to lose feeling or have a pins-and-needles sensation on part of your body. Nerves may be damaged from trauma, toxins, or diseases, such as diabetes or multiple sclerosis (MS). Sometimes, though, the cause is not clear.  If there is no clear reason for your symptoms, and you are not having any other symptoms, your doctor may suggest watching and waiting for a while to see if the numbness or tingling goes away on its own. Your doctor may want you to have blood or nerve tests to find the cause of your symptoms.  Follow-up care is a key part of your treatment and safety. Be sure to make and go to all appointments, and call your doctor if you are having problems. It's also a good idea to know your test results and keep a list of the medicines you take.  How can you care for yourself at home?  ?? If your doctor prescribes medicine, take it exactly as directed. Call your doctor if you think you are having a problem with your medicine.  ?? If you have any swelling, put ice or a cold pack on the area for 10 to 20 minutes at a time. Put a thin cloth between the ice and your skin.  When should you call for help?  Call 911 anytime you think you may need emergency care. For example, call if:  ?? You have weakness, numbness, or tingling in both legs.  ?? You lose bowel or bladder control.  ?? You have signs of a stroke. These may include:  ?? Sudden new numbness, or sudden paralysis or weakness in your face, arm, or leg, especially on only one side of your body.  ?? New problems with walking or balance.  ?? Sudden vision changes.  ?? Drooling or slurred speech.  ?? New problems speaking or understanding simple statements, or feeling confused.  ?? A sudden, severe headache that is different from past headaches.  Watch closely for changes in your health, and be sure to  contact your doctor if you have any problems, or if:  ?? You do not get better as expected.    Where can you learn more?    Go to [email or web address] and sign in to your MyChart account.    Enter U128 in the Search Health Information box to learn more about ???Numbness and Tingling: After Your Visit.???    If you do not have an account, please click on the ???Sign Up Now??? link.    ?? 2006-2012 [employer]. Care instructions adapted under license by [facility]. This care instruction is for use with your licensed healthcare professional. If you have questions about a medical condition or this instruction, always ask your healthcare professional. Health[employer], Incorporated disclaims any warranty or liability for your use of this information.  Content Version: 9.4.94723; Last Revised: May 25, 2009

## 2011-11-27 NOTE — Progress Notes (Signed)
Subjective:      Patient ID: Andrea Pitts is a 46 y.o. female.    HPI was last seen by Dr.Bhoopathy .  Has hx of pseudotumor cerebri and bilateral vision loss and on Diomax and initially when she was diagnosed was following with [facility] and was informed no surgical intervention only medical management .she denies no complaints today .  She lost her job after her vision loss in 2009 .  Has hx of HTN  :stable ,takes all meds ,checks Bp regularly . No issues .deneis CP,SOB .    C/o burning sensation in the feet from couploe of months an dnow its been worsening . No known diabetic .  Denies any numbness or weakness .    Review of Systems   Constitutional: Negative for fever and chills.   Eyes: Positive for visual disturbance (b/l peripheral visual loss ).   Respiratory: Negative for shortness of breath.    Cardiovascular: Negative for chest pain and palpitations.   Gastrointestinal: Negative for nausea, vomiting, abdominal pain and constipation.   Endocrine: Negative for polydipsia, polyphagia and polyuria.   Musculoskeletal: Negative for gait problem.   Neurological: Negative for dizziness, weakness, numbness and headaches.       Objective:   Physical Exam   Constitutional: She appears well-developed and well-nourished. No distress.   Cardiovascular: Normal rate, regular rhythm and intact distal pulses.  Exam reveals no gallop and no friction rub.    No murmur heard.  Pulmonary/Chest: Breath sounds normal. She has no wheezes.   Musculoskeletal: She exhibits no edema.   Monofilament :  Sensations intact .      BP 120/82  Pulse 82  Temp(Src) 98.3 F (36.8 C) (Oral)  Resp 16  Ht 5\' 4"  (1.626 m)  Wt 307 lb (139.254 kg)  BMI 52.67 kg/m2  LMP 06/04/2009    Assessment/plan :       1. HTN :  -controlled .  -refills given on all her meds .  -labs ordered .  -DASH diet instructions given .  -advised to follow with ophthalmologist .    2.Paresthesia :  -will check A1C ,vit B12 ,FA .    3.Pseudotumor cerebri  :  -stable .  -cont diamox .      F/u in 1 month .

## 2011-11-28 LAB — COMPREHENSIVE METABOLIC PANEL
ALT: 25 U/L (ref 10–49)
AST: 24 U/L (ref 0–33)
Albumin: 4.3 g/dL (ref 3.2–4.8)
Alkaline Phosphatase: 68 U/L (ref 45–129)
BUN: 13 mg/dL (ref 6–20)
CO2: 23 mmol/L (ref 20–31)
Calcium: 9.7 mg/dL (ref 8.6–10.5)
Chloride: 108 mmol/L (ref 99–109)
Creatinine: 1.2 mg/dL — ABNORMAL HIGH (ref 0.5–1.1)
Glucose: 96 mg/dL (ref 70–110)
Potassium: 3.6 mmol/L (ref 3.5–5.5)
Sodium: 139 mmol/L (ref 132–146)
Total Bilirubin: 0.7 mg/dL (ref 0.3–1.2)
Total Protein: 7.9 g/dL (ref 5.7–8.2)

## 2011-11-28 LAB — HEMOGLOBIN A1C: Hemoglobin A1C: 5.7 % (ref 4.8–6.0)

## 2011-11-28 LAB — VITAMIN B12 & FOLATE
Folate: >24 ng/mL (ref 5.4–24.0)
Vitamin B-12: 544 pg/mL (ref 211–911)

## 2011-11-28 LAB — LIPID PANEL
Cholesterol: 224 mg/dL — ABNORMAL HIGH (ref 0–199)
HDL: 42 mg/dL (ref >40.0)
LDL Calculated: 159 mg/dL — ABNORMAL HIGH (ref 0–99)
Triglycerides: 114 mg/dL (ref 0–149)

## 2011-11-28 LAB — CBC
Hematocrit: 41 % (ref 34.0–48.0)
Hemoglobin: 13.3 g/dL (ref 11.5–15.5)
MCH: 29.6 pg (ref 26.0–35.0)
MCHC: 32.5 % (ref 32.0–34.5)
MCV: 91.3 fL (ref 80.0–99.9)
MPV: 6.9 fL — ABNORMAL LOW (ref 7.0–12.0)
Platelets: 306 E9/L (ref 130–450)
RBC: 4.5 E12/L (ref 3.50–5.50)
RDW: 13.5 fL (ref 11.5–15.0)
WBC: 6 E9/L (ref 4.5–11.5)

## 2011-11-28 LAB — GFR CALCULATED: Gfr Calculated: 58 mL/min/{1.73_m2} — AB (ref >=60)

## 2011-11-28 LAB — TSH: TSH: 1.903 u[IU]/mL (ref 0.350–5.000)

## 2011-11-28 NOTE — Progress Notes (Signed)
Please send to Dr. Angela Cox. Thanks

## 2011-12-12 NOTE — Progress Notes (Signed)
Final result previously completed and in EPIC.

## 2011-12-15 NOTE — Telephone Encounter (Signed)
I called and left the VM to call me back .  Thanks

## 2011-12-15 NOTE — Telephone Encounter (Signed)
Please call patient with lab results at [phone] thanks

## 2012-01-07 ENCOUNTER — Inpatient Hospital Stay
Admit: 2012-01-07 | Discharge: 2012-01-07 | Disposition: A | Discharge status: Home / Self Care | Attending: Emergency Medicine

## 2012-01-07 LAB — EKG 12-LEAD

## 2012-01-07 MED ORDER — NAPROXEN 500 MG PO TABS
500 MG | ORAL_TABLET | Freq: Two times a day (BID) | ORAL | Status: DC
Start: 2012-01-07 — End: 2013-09-01

## 2012-01-07 MED ORDER — TRIAMCINOLONE ACETONIDE 0.025 % EX CREA
0.025 % | CUTANEOUS | Status: DC
Start: 2012-01-07 — End: 2012-12-13

## 2012-01-07 MED ORDER — OXYCODONE-ACETAMINOPHEN 5-325 MG PO TABS
5-325 MG | ORAL_TABLET | Freq: Four times a day (QID) | ORAL | Status: AC | PRN
Start: 2012-01-07 — End: 2012-01-10

## 2012-01-07 MED ADMIN — HYDROcodone-acetaminophen (NORCO) 5-325 MG per tablet 1 tablet: 1 | ORAL | @ 15:00:00 | NDC 00406036562

## 2012-01-07 MED FILL — HYDROCODONE-ACETAMINOPHEN 5-325 MG PO TABS: 5-325 mg | ORAL | Qty: 1

## 2012-01-07 NOTE — Discharge Instructions (Signed)
Shoulder Pain  The shoulder is a ball and socket joint. The muscles and tendons (rotator cuff) are what keep the shoulder in its joint and stable. This collection of muscles and tendons holds in the head (ball) of the humerus (upper arm bone) in the fossa (cup) of the scapula (shoulder blade). Today no reason was found for your shoulder pain. Often pain in the shoulder may be treated conservatively with temporary immobilization. For example, holding the shoulder in one place using a sling for rest. Physical therapy may be needed if problems continue.  HOME CARE INSTRUCTIONS    Apply ice to the sore area for 15 to 20 minutes, 3 to 4 times per day for the first 2 days. Put the ice in a plastic bag. Place a towel between the bag of ice and your skin.   If you have or were given a shoulder sling and straps, do not remove for as long as directed by your caregiver or until you see a caregiver for a follow-up examination. If you need to remove it to shower or bathe, move your arm as little as possible.   Sleep on several pillows at night to lessen swelling and pain.   Only take over-the-counter or prescription medicines for pain, discomfort, or fever as directed by your caregiver.   Keep any follow-up appointments in order to avoid any type of permanent shoulder disability or chronic pain problems.  SEEK MEDICAL CARE IF:    Pain in your shoulder increases or new pain develops in your arm, hand, or fingers.   Your hand or fingers are colder than your other hand.   You do not obtain pain relief with the medications or your pain becomes worse.  SEEK IMMEDIATE MEDICAL CARE IF:    Your arm, hand, or fingers are numb or tingling.   Your arm, hand, or fingers are swollen, painful, or turn white or blue.   You develop chest pain or shortness of breath.  MAKE SURE YOU:    Understand these instructions.   Will watch your condition.   Will get help right away if you are not doing well or get worse.  Document Released:  05/31/2005 Document Revised: 08/10/2011 Document Reviewed: 08/05/2011  [facility] Patient Information 2012 [place].

## 2012-01-07 NOTE — ED Provider Notes (Signed)
HPI:  01/07/2012,   Time: 11:40 AM         Andrea Pitts is a 46 y.o. female presenting to the ED for left shoulder pain beginning 3 days ago.  The complaint has been persistent, moderate in severity, and worsened by movement of arm.  Patient states she is having Left shoulder pain that began 3 days ago. She denies any trauma to the area in his arm to lift any heavy objects. She states the pain is worse when she was her arm up. She states that the left shoulder area appears more swollen than the right side. She states that the pain is a pressure and sometimes stabbing pain that'll go into her neck and into the left side of her chest. She has no history of any coronary artery disease however does have history of hypertension. Patient denies any fever, chills, fatigue, headache, shortness of breath, cough, wheezing, abdominal pain, nausea, vomiting, diarrhea, constipation, numbness, weakness.      ROS:   Unless otherwise stated in this report or unable to obtain because of the patient's clinical or mental status as evidenced by the medical record, this patients's positive and negative responses for Review of Systems, constitutional, psych, eyes, ENT, cardiovascular, respiratory, gastrointestinal, neurological, genitourinary, musculoskeletal, integument systems and systems related to the presenting problem are either stated in the preceding or were not pertinent or were negative for the symptoms and/or complaints related to the medical problem.      --------------------------------------------- PAST HISTORY ---------------------------------------------  Past Medical History:  has a past medical history of Hypertension; Low vision both eyes; Pseudotumor cerebri; and Asthma.    Past Surgical History:  has past surgical history that includes Cholecystectomy and Hysterectomy.    Social History:  reports that she has never smoked. She does not have any smokeless tobacco history on file. She reports that she does not  drink alcohol.    Family History: family history is not on file.     The patient's home medications have been reviewed.    Allergies: Penicillins    -------------------------------------------------- RESULTS -------------------------------------------------  All laboratory and radiology results have been personally reviewed by myself   LABS:  No results found for this visit on 01/07/12.    RADIOLOGY:  Interpreted by Radiologist.  XR SHOULDER LEFT STANDARD    (Results Pending)   Xr Shoulder Left Standard    01/07/2012  "" Exam Start [id number] History- Nontraumatic shoulder pain  Three views of the left shoulder show no bony abnormalities. The acromioclavicular and glenohumeral relationships are normal. Is no fracture-dislocation present. No lytic or blastic lesions are seen.  Impression- Negative exam        Transcriptionist- PRR   M.D.      Read ByWhitney Post   M.D.      Released ByWhitney Post   M.D.      Released Date Time- 01/07/12 1218  ------------------------------------------------------------------------------       ------------------------- NURSING NOTES AND VITALS REVIEWED ---------------------------   The nursing notes within the ED encounter and vital signs as below have been reviewed.   BP 136/72  Pulse 80  Temp(Src) 98.2 F (36.8 C) (Oral)  Resp 16  Ht 5\' 4"  (1.626 m)  Wt 300 lb (136.079 kg)  BMI 51.47 kg/m2  SpO2 98%  LMP 06/04/2009  Oxygen Saturation Interpretation: Normal      ---------------------------------------------------PHYSICAL EXAM--------------------------------------      Constitutional/General: Alert and oriented x3, well appearing, non toxic in NAD  Head: NC/AT  Eyes: PERRL, EOMI  Mouth: Oropharynx clear, handling secretions, no trismus  Neck: Supple, full ROM, no meningeal signs  Pulmonary: Lungs clear to auscultation bilaterally, no wheezes, rales, or rhonchi. Not in respiratory distress  Cardiovascular:  Regular rate and rhythm, no murmurs, gallops, or rubs. 2+ distal  pulses  Abdomen: Soft, non tender, non distended,   Extremities: Moves all extremities x 4. Warm and well perfused. L shoulder with reproducible pain with raising arm. ROM intact. Strength intact  Skin: warm and dry without rash  Neurologic: GCS 15,  Psych: Normal Affect      ------------------------------ ED COURSE/MEDICAL DECISION MAKING----------------------  Medications   HYDROcodone-acetaminophen (NORCO) 5-325 MG per tablet 1 tablet (1 tablet Oral Given 01/07/12 1123)     EKG: normal sinus rhythm with 1st degree AV block. Rate 67      Medical Decision Making:    Patient to FU with clinic for possible outpatient MRI    Counseling:   The emergency provider has spoken with the patient and discussed today's results, in addition to providing specific details for the plan of care and counseling regarding the diagnosis and prognosis.  Questions are answered at this time and they are agreeable with the plan.      --------------------------------- IMPRESSION AND DISPOSITION ---------------------------------    IMPRESSION  No diagnosis found.    DISPOSITION  Disposition: Discharge to home  Patient condition is good                  Andrea Pitts, [place]  01/07/12 1305    ATTENDING PROVIDER ATTESTATION:     I have personally performed and/or participated in the history, exam, medical decision making, and procedures and agree with all pertinent clinical information.      I have also reviewed and agree with the past medical, family and social history unless otherwise noted.      Andrea Alpha, MD  01/07/12 1308

## 2012-01-10 MED ORDER — HYDROCODONE-ACETAMINOPHEN 5-325 MG PO TABS
5-325 MG | ORAL_TABLET | Freq: Four times a day (QID) | ORAL | Status: DC | PRN
Start: 2012-01-10 — End: 2012-12-23

## 2012-01-10 MED ORDER — MELOXICAM 7.5 MG PO TABS
7.5 MG | ORAL_TABLET | Freq: Two times a day (BID) | ORAL | Status: DC
Start: 2012-01-10 — End: 2013-09-01

## 2012-01-10 NOTE — Patient Instructions (Signed)
Shoulder Pain: After Your Visit  Your Care Instructions  You can hurt your shoulder by using it too much during an activity, such as fishing or baseball. It can also happen as part of the everyday wear and tear of getting older. Shoulder injuries can be slow to heal, but your shoulder should get better with time.  Your doctor may recommend a sling to rest your shoulder. If you have injured your shoulder, you may need testing and treatment.  Follow-up care is a key part of your treatment and safety. Be sure to make and go to all appointments, and call your doctor if you are having problems. It???s also a good idea to know your test results and keep a list of the medicines you take.  How can you care for yourself at home?  ?? Take pain medicines exactly as directed.  ?? If the doctor gave you a prescription medicine for pain, take it as prescribed.  ?? If you are not taking a prescription pain medicine, ask your doctor if you can take an over-the-counter medicine.  ?? Do not take two or more pain medicines at the same time unless the doctor told you to. Many pain medicines contain acetaminophen, which is Tylenol. Too much acetaminophen (Tylenol) can be harmful.  ?? If your doctor recommends that you wear a sling, use it as directed. Do not take it off before your doctor tells you to.  ?? Put ice or a cold pack on the sore area for 10 to 20 minutes at a time. Put a thin cloth between the ice and your skin.  ?? If there is no swelling, you can put moist heat, a heating pad, or a warm cloth on your shoulder. Some doctors suggest alternating between hot and cold.  ?? Rest your shoulder for a few days. If your doctor recommends it, you can then begin gentle exercise of the shoulder, but do not lift anything heavy.  When should you call for help?  Call 911 anytime you think you may need emergency care. For example, call if:  ?? You have chest pain or pressure. This may occur with:  ?? Sweating.  ?? Shortness of breath.  ?? Nausea or  vomiting.  ?? Pain that spreads from the chest to the neck, jaw, or one or both shoulders or arms.  ?? Dizziness or lightheadedness.  ?? A fast or uneven pulse.  After calling 911, chew 1 adult-strength aspirin. Wait for an ambulance. Do not try to drive yourself.  ?? Your arm or hand is cool or pale or changes color.  Call your doctor now or seek immediate medical care if:  ?? You have signs of infection, such as:  ?? Increased pain, swelling, warmth, or redness in your shoulder.  ?? Red streaks leading from a place on your shoulder.  ?? Pus draining from an area of your shoulder.  ?? Swollen lymph nodes in your neck, armpits, or groin.  ?? A fever.  Watch closely for changes in your health, and be sure to contact your doctor if:  ?? You cannot use your shoulder.  ?? Your shoulder does not get better as expected.    Where can you learn more?    Go to [email or web address] and sign in to your MyChart account.    Enter [phone] in the Search Health Information box to learn more about ???Shoulder Pain: After Your Visit.???    If you do not have an account, please click on the ???Sign  Up Now??? link.    ?? 2006-2012 [employer], [employer]. Care instructions adapted under license by [facility]. This care instruction is for use with your licensed healthcare professional. If you have questions about a medical condition or this instruction, always ask your healthcare professional. [employer], Inc[employer] disclaims any warranty or liability for your use of this information.  Content Version: 9.4.94723; Last Revised: October 06, 2009

## 2012-01-10 NOTE — Progress Notes (Signed)
Subjective:      Patient ID: Andrea Pitts is a 46 y.o. female.    HPI Comments: Seen in ED 4 days ago for Left shoulder pain. Xrays neg. No trauma, still with severe pain. Is out of Percocet. Feels swollen to her.     Shoulder Pain   The pain is present in the left shoulder and neck (left anterior upper chest). This is a new problem. The current episode started in the past 7 days. There has been no history of extremity trauma. The problem occurs constantly. The problem has been unchanged. The quality of the pain is described as sharp and burning. The pain is at a severity of 10/10. The pain is severe. Associated symptoms include joint swelling, a limited range of motion and stiffness. Pertinent negatives include no fever, numbness or tingling. The symptoms are aggravated by activity. She has tried OTC ointments, heat and oral narcotics for the symptoms. The treatment provided moderate relief. Her past medical history is significant for osteoarthritis. There is no history of diabetes or gout.       Review of Systems   Constitutional: Negative for fever.   Musculoskeletal: Positive for stiffness. Negative for gout.   Neurological: Negative for tingling and numbness.       Objective:   Physical Exam   Constitutional: She is oriented to person, place, and time. She appears well-developed and well-nourished. No distress.   Cardiovascular: Normal rate, regular rhythm and normal heart sounds.  Exam reveals no gallop and no friction rub.    No murmur heard.  Pulmonary/Chest: Effort normal and breath sounds normal. No respiratory distress. She has no wheezes. She has no rales.   Musculoskeletal:        Left shoulder: She exhibits decreased range of motion, tenderness (upper trapezius, shoulder and upper anterior chest.), bony tenderness, swelling, crepitus, pain, spasm and decreased strength (mild). She exhibits no effusion, no deformity, no laceration and normal pulse.   Neurological: She is alert and oriented to  person, place, and time. She has normal reflexes.   Skin: Skin is warm and dry. No rash noted. She is not diaphoretic. No erythema.   Psychiatric: She has a normal mood and affect. Her behavior is normal.   BP 132/80   Pulse 90   Temp(Src) 98.6 ??F (37 ??C) (Oral)   Resp 20   Wt 307 lb (139.254 kg)   BMI 52.67 kg/m2   LMP 06/04/2009      Assessment:     Aki was seen today for follow-up from hospital.    Diagnoses and associated orders for this visit:    Shoulder pain, acute, R/O rotator cuff tear. Has weakness and mild swelling.  - HYDROcodone-acetaminophen (NORCO) 5-325 MG per tablet; Take 1 tablet by mouth every 6 hours as needed for Pain (1-2 QID prn).  - meloxicam (MOBIC) 7.5 MG tablet; Take 1 tablet by mouth 2 times daily.  - MRI shoulder left without contrast; Future            Plan:      FU after MRI.

## 2012-01-31 ENCOUNTER — Encounter

## 2012-01-31 NOTE — Telephone Encounter (Signed)
Please call. No tears in shoulder. Begin PT. I will order.  Be sure to FU with Dr. Mignon Pine

## 2012-02-01 NOTE — Telephone Encounter (Signed)
Patient notified, script for physical therapy given to girls at front desk to schedule.

## 2012-03-11 NOTE — Progress Notes (Signed)
Final result previously completed and in EPIC.

## 2012-10-06 NOTE — Telephone Encounter (Signed)
Please address

## 2012-10-07 MED ORDER — ACETAZOLAMIDE 250 MG PO TABS
250 MG | ORAL_TABLET | Freq: Four times a day (QID) | ORAL | Status: DC
Start: 2012-10-07 — End: 2012-10-07

## 2012-10-07 MED ORDER — ACETAZOLAMIDE 250 MG PO TABS
250 MG | ORAL_TABLET | Freq: Four times a day (QID) | ORAL | Status: DC
Start: 2012-10-07 — End: 2012-12-23

## 2012-12-13 ENCOUNTER — Inpatient Hospital Stay
Admit: 2012-12-13 | Discharge: 2012-12-14 | Disposition: A | Discharge status: Home / Self Care | Attending: Emergency Medicine

## 2012-12-13 NOTE — ED Notes (Signed)
Denies any current nausea/pain- had some discomfort earlier    Harley Alto, RN  12/13/12 2249

## 2012-12-13 NOTE — ED Notes (Signed)
Orange juice provided- still c/o sore throat    Linde Gillis, RN  12/13/12 2132

## 2012-12-13 NOTE — ED Provider Notes (Signed)
HPI Comments: Andrea Pitts is complaining of sore throat bilateral ear discomfort she states her ears feel full which began 2 days PTA. She is also complaining of some diarrhea and nausea that began yesterday. She is having a cough for approximately 3-4 days. She also complains of generalized body aches and chills. Discomfort is rated moderate to severe. The symptoms have been constant for one day and came on gradually. She has not used medication at home for the symptoms. She has had previous symptoms and seemed to be the flu. She thinks she has the flu. She is unsure if she had a fever due to did not take temperature PTA.    The history is provided by the patient. No language interpreter was used.         Review of Systems   Constitutional: Positive for chills, activity change, appetite change and fatigue. Negative for fever, diaphoresis and unexpected weight change.        She is having decreased appetite and chills. Unsure of fever she did not take her temperature.   HENT: Positive for ear pain, congestion, sore throat and rhinorrhea. Negative for hearing loss, nosebleeds, facial swelling, sneezing, drooling, mouth sores, trouble swallowing, neck pain, neck stiffness, dental problem, voice change, postnasal drip, sinus pressure and tinnitus.    Eyes: Negative.    Respiratory: Positive for cough. Negative for apnea, choking, chest tightness, shortness of breath, wheezing and stridor.    Cardiovascular: Negative.    Gastrointestinal: Positive for nausea, abdominal pain and diarrhea. Negative for vomiting, constipation, blood in stool, abdominal distention, anal bleeding and rectal pain.   Endocrine: Negative.    Genitourinary: Negative.    Musculoskeletal: Positive for arthralgias. Negative for myalgias, back pain, joint swelling and gait problem.   Skin: Negative for color change, pallor, rash and wound.   Neurological: Negative.    Hematological: Negative.    Psychiatric/Behavioral: Negative.        Physical Exam    Constitutional: She is oriented to person, place, and time. She appears well-developed and well-nourished.   HENT:   Head: Normocephalic and atraumatic.   Right Ear: External ear normal.   Left Ear: External ear normal.   Nose: Nose normal.   Mouth/Throat: Oropharyngeal exudate present.   Nasal drainage began yesterday patient unsure if it is clear due to difficulty with vision this is not new.   Eyes: Conjunctivae are normal. Pupils are equal, round, and reactive to light. Right eye exhibits no discharge. Left eye exhibits no discharge.   Neck: Normal range of motion. Neck supple.   Cardiovascular: Normal rate, regular rhythm, normal heart sounds and intact distal pulses.    No murmur heard.  Pulmonary/Chest: Effort normal and breath sounds normal. No stridor. No respiratory distress. She has no wheezes. She has no rales. She exhibits no tenderness.   Abdominal: Soft. Bowel sounds are normal. She exhibits no distension and no mass. There is no tenderness. There is no rebound and no guarding.   Genitourinary: No vaginal discharge found.   Musculoskeletal: Normal range of motion. She exhibits no edema and no tenderness.   Neurologic muscle aches   Lymphadenopathy:     She has no cervical adenopathy.   Neurological: She is alert and oriented to person, place, and time.   Skin: Skin is warm and dry. No rash noted. No erythema. No pallor.   Psychiatric: She has a normal mood and affect. Her behavior is normal. Judgment and thought content normal.     Xr  Chest Standard Two Vw    12/13/2012  Exam: Chest PA and lateral views.   Comparison: December 13, 2012.   Findings: Lungs are normally expanded. No infiltrates, consolidations, or pleural effusions seen. Heart has a normal size. The mediastinum appears unremarkable.        12/13/2012  IMPRESSION: No acute cardiopulmonary process.   Procedures    ----------------------------------------------- PAST HISTORY --------------------------------------------  Past Medical History:   has a past medical history of Hypertension; Low vision both eyes; Pseudotumor cerebri; and Asthma.    Past Surgical History:  has past surgical history that includes Cholecystectomy and Hysterectomy.    Social History:  reports that she has never smoked. She does not have any smokeless tobacco history on file. She reports that she does not drink alcohol.    Family History: family history is not on file.     The patient???s home medications have been reviewed.    Allergies: Penicillins    --------------------------------------------------- RESULTS ----------------------------------------------    LABS:   Results for orders placed during the hospital encounter of 12/13/12   STREP SCREEN GROUP A THROAT       Result Value Range    Rapid Strep A Screen Negative  Negative    Rapid A Strep Antigen QC see below         RADIOLOGY:      ------------------------------NURSING NOTES AND VITALS REVIEWED -----------------------     The nursing notes within the ED encounter and vital signs as below have been reviewed.   BP 160/74   Pulse 96   Temp(Src) 98 ??F (36.7 ??C) (Oral)   Resp 18   Ht 5\' 4"  (1.626 m)   Wt 300 lb (136.079 kg)   BMI 51.47 kg/m2   SpO2 98%   LMP 06/04/2009  Oxygen Saturation Interpretation: Normal      ------------------------------------------- PROGRESS NOTES ------------------------------------------                  I have spoken with the patient and discussed today???s results, in addition to providing specific details for the plan of care and counseling regarding the diagnosis and prognosis.  Questions were answered at this time and are agreeable with the plan.    ED Course Medications:                Medications   0.9 % sodium chloride bolus (1,000 mLs Intravenous Given 12/13/12 2234)   ondansetron (ZOFRAN) injection 4 mg (4 mg Intravenous Given 12/13/12 2234)       Re-examinations:              Time: 2300   Patient???s symptoms are improving.  Repeat physical examination is stable.    Consultations:          none.    PROCEDURES:               none.    --------------------------------------- IMPRESSION & DISPOSITION -----------------------------     IMPRESSION:  1. Viral gastroenteritis    2. Upper respiratory infection        DISPOSITION  Disposition: Discharge to home.  Patient condition is good. Patient instructed to return with change in symptoms or worsening, understanding verbalized.  END OF PROVIDER NOTE.    MDM    Labs      Radiology      EKG Interpretation.        Roma Schanz, NP  12/13/12 2342    Roma Schanz, NP  12/15/12 1535  ATTENDING PROVIDER  ATTESTATION:     I have personally performed and/or participated in the history, exam, medical decision making, and procedures and agree with all pertinent clinical information.      I have also reviewed and agree with the past medical, family and social history unless otherwise noted.      Evonnie Pat, MD  12/16/12 [id number]

## 2012-12-13 NOTE — Discharge Instructions (Signed)
B.R.A.T. Diet  Your doctor has recommended the B.R.A.T. diet for you or your child until the condition improves. This is often used to help control diarrhea and vomiting symptoms. If you or your child can tolerate clear liquids, you may have:   Bananas.   Rice.   Applesauce.   Toast (and other simple starches such as crackers, potatoes, noodles).  Be sure to avoid dairy products, meats, and fatty foods until symptoms are better. Fruit juices such as apple, grape, and prune juice can make diarrhea worse. Avoid these. Continue this diet for 2 days or as instructed by your caregiver.  Document Released: 08/21/2005 Document Revised: 11/13/2011 Document Reviewed: 02/07/2007  [facility] Patient Information 2013 [place].    Common Cold, Adult  An upper respiratory tract infection, or cold, is a viral infection of the air passages to the lung. Colds are contagious, especially during the first 3 or 4 days. Antibiotics cannot cure a cold. Cold germs are spread by coughs, sneezes, and hand to hand contact. A respiratory tract infection usually clears up in a few days, but some people may be sick for a week or two.  HOME CARE INSTRUCTIONS   Only take over-the-counter or prescription medicines for pain, discomfort, or fever as directed by your caregiver.    Be careful not to blow your nose too hard. This may cause a nosebleed.    Use a cool-mist humidifier (vaporizer) to increase air moisture. This will make it easier for you to breath. Do not use hot steam.    Rest as much as possible and get plenty of sleep.    Wash your hands often, especially after you blow your nose. Cover your mouth and nose with a tissue when you sneeze or cough.    Drink at least 8 glasses of clear liquids every day, such as water, fruit juices, tea, clear soups, and carbonated beverages.   SEEK MEDICAL CARE IF:   An oral temperature above 102 F (38.9 C) develops, or as your caregiver suggests, which lasts 2 days or more, and is not  controlled by medication.    You have a sore throat that gets worse or you see white or yellow spots in your throat.    Your cough gets worse or lasts more than 10 days.    You have a rash somewhere on your skin. You have large and tender lumps in your neck.    You have an earache or a headache.    You have thick, greenish or yellowish discharge from your nose.    You cough-up thick yellow, green, gray or bloody mucus (secretions).   SEEK IMMEDIATE MEDICAL CARE IF:  You have trouble breathing, chest pain, or your skin or nails look gray or blue.  MAKE SURE YOU:    Understand these instructions.    Will watch your condition.    Will get help right away if you are not doing well or get worse.   Document Released: 12/10/2002 Document Re-Released: 08/03/2008  [facility] Patient Information 2012 [place].  Gastroenteritis  You have gastroenteritis. This is a common viral illness. Symptoms can include nausea, vomiting, stomach cramps, diarrhea, and a slight fever. Most of the time viral gastroenteritis clears up in 2-3 days with bed rest and a clear liquid diet.  If you are not vomiting, you can start having 3 to 5 small sips of water (or other clear liquid) every 20-30 minutes. Once nausea has cleared and diarrhea slowed, gradually increase clear liquids,  over the next 12 hours to 1-2 cups every hour as tolerated. You can then try sodas, Gatorade, broth, and jell-o if your cramps and nausea are gone. Try crackers and dry toast as your symptoms improve, usually by 24 hours, but progress slowly with small helpings. Stay away from milk and dairy products, alcohol, and drugs that upset your stomach for the next 2 to 3 days.  Vomiting with gastroenteritis may be as violent and prolonged as with food poisoning. If other members of your family also become ill, check with your health care provider's office or the health department. Wash your hands well to avoid spreading any germs (bacteria or virus).  SEEK  IMMEDIATE MEDICAL CARE IF:   There is a fainting episode.    You or your child are unable to keep fluids down.    Signs of dehydration including, dry mucous membranes of the mouth, no tears from the eyes, decreased urination or less wetting of diapers are present.    Abdominal pain develops, increases or localizes. (Right sided pain can be appendicitis and left sided pain in adults can be diverticulitis).    You or your child develop a high fever (an oral temperature above 102 F (38.9 C) for over 3 days).    Diarrhea becomes excessive or contains blood or mucus.    Lethargy or confusion develops.    The amount of urine decreases.    Vomiting or diarrhea persists more than 48 hours.    Excessive weakness, dizziness, fainting or extreme thirst develops.   Document Released: 08/21/2005 Document Re-Released: 08/09/2009  [facility] Patient Information 2012 [place].  Upper Respiratory Infection, Adult  An upper respiratory infection (URI) is also sometimes known as the common cold. The upper respiratory tract includes the nose, sinuses, throat, trachea, and bronchi. Bronchi are the airways leading to the lungs. Most people improve within 1 week, but symptoms can last up to 2 weeks. A residual cough may last even longer.   CAUSES  Many different viruses can infect the tissues lining the upper respiratory tract. The tissues become irritated and inflamed and often become very moist. Mucus production is also common. A cold is contagious. You can easily spread the virus to others by oral contact. This includes kissing, sharing a glass, coughing, or sneezing. Touching your mouth or nose and then touching a surface, which is then touched by another person, can also spread the virus.  SYMPTOMS   Symptoms typically develop 1 to 3 days after you come in contact with a cold virus. Symptoms vary from person to person. They may include:   Runny nose.   Sneezing.   Nasal congestion.   Sinus irritation.   Sore  throat.   Loss of voice (laryngitis).   Cough.   Fatigue.   Muscle aches.   Loss of appetite.   Headache.   Low-grade fever.  DIAGNOSIS   You might diagnose your own cold based on familiar symptoms, since most people get a cold 2 to 3 times a year. Your caregiver can confirm this based on your exam. Most importantly, your caregiver can check that your symptoms are not due to another disease such as strep throat, sinusitis, pneumonia, asthma, or epiglottitis. Blood tests, throat tests, and X-rays are not necessary to diagnose a common cold, but they may sometimes be helpful in excluding other more serious diseases. Your caregiver will decide if any further tests are required.  RISKS AND COMPLICATIONS   You may be at risk for  a more severe case of the common cold if you smoke cigarettes, have chronic heart disease (such as heart failure) or lung disease (such as asthma), or if you have a weakened immune system. The very young and very old are also at risk for more serious infections. Bacterial sinusitis, middle ear infections, and bacterial pneumonia can complicate the common cold. The common cold can worsen asthma and chronic obstructive pulmonary disease (COPD). Sometimes, these complications can require emergency medical care and may be life-threatening.  PREVENTION   The best way to protect against getting a cold is to practice good hygiene. Avoid oral or hand contact with people with cold symptoms. Wash your hands often if contact occurs. There is no clear evidence that vitamin C, vitamin E, echinacea, or exercise reduces the chance of developing a cold. However, it is always recommended to get plenty of rest and practice good nutrition.  TREATMENT   Treatment is directed at relieving symptoms. There is no cure. Antibiotics are not effective, because the infection is caused by a virus, not by bacteria. Treatment may include:   Increased fluid intake. Sports drinks offer valuable electrolytes, sugars, and  fluids.   Breathing heated mist or steam (vaporizer or shower).   Eating chicken soup or other clear broths, and maintaining good nutrition.   Getting plenty of rest.   Using gargles or lozenges for comfort.   Controlling fevers with ibuprofen or acetaminophen as directed by your caregiver.   Increasing usage of your inhaler if you have asthma.  Zinc gel and zinc lozenges, taken in the first 24 hours of the common cold, can shorten the duration and lessen the severity of symptoms. Pain medicines may help with fever, muscle aches, and throat pain. A variety of non-prescription medicines are available to treat congestion and runny nose. Your caregiver can make recommendations and may suggest nasal or lung inhalers for other symptoms.   HOME CARE INSTRUCTIONS    Only take over-the-counter or prescription medicines for pain, discomfort, or fever as directed by your caregiver.   Use a warm mist humidifier or inhale steam from a shower to increase air moisture. This may keep secretions moist and make it easier to breathe.   Drink enough water and fluids to keep your urine clear or pale yellow.   Rest as needed.   Return to work when your temperature has returned to normal or as your caregiver advises. You may need to stay home longer to avoid infecting others. You can also use a face mask and careful hand washing to prevent spread of the virus.  SEEK MEDICAL CARE IF:    After the first few days, you feel you are getting worse rather than better.   You need your caregiver's advice about medicines to control symptoms.   You develop chills, worsening shortness of breath, or brown or red sputum. These may be signs of pneumonia.   You develop yellow or brown nasal discharge or pain in the face, especially when you bend forward. These may be signs of sinusitis.   You develop a fever, swollen neck glands, pain with swallowing, or white areas in the back of your throat. These may be signs of strep throat.  SEEK  IMMEDIATE MEDICAL CARE IF:    You have a fever.   You develop severe or persistent headache, ear pain, sinus pain, or chest pain.   You develop wheezing, a prolonged cough, cough up blood, or have a change in your usual mucus (if you  have chronic lung disease).   You develop sore muscles or a stiff neck.  Document Released: 02/14/2001 Document Revised: 11/13/2011 Document Reviewed: 12/23/2010  [facility] Patient Information 2013 [place].    Viral Infections  A viral infection can be caused by different types of viruses.Most viral infections are not serious and resolve on their own. However, some infections may cause severe symptoms and may lead to further complications.  SYMPTOMS  Viruses can frequently cause:   Minor sore throat.   Aches and pains.   Headaches.   Runny nose.   Different types of rashes.   Watery eyes.   Tiredness.   Cough.   Loss of appetite.   Gastrointestinal infections, resulting in nausea, vomiting, and diarrhea.  These symptoms do not respond to antibiotics because the infection is not caused by bacteria. However, you might catch a bacterial infection following the viral infection. This is sometimes called a "superinfection." Symptoms of such a bacterial infection may include:   Worsening sore throat with pus and difficulty swallowing.   Swollen neck glands.   Chills and a high or persistent fever.   Severe headache.   Tenderness over the sinuses.   Persistent overall ill feeling (malaise), muscle aches, and tiredness (fatigue).   Persistent cough.   Yellow, green, or brown mucus production with coughing.  HOME CARE INSTRUCTIONS    Only take over-the-counter or prescription medicines for pain, discomfort, diarrhea, or fever as directed by your caregiver.   Drink enough water and fluids to keep your urine clear or pale yellow. Sports drinks can provide valuable electrolytes, sugars, and hydration.   Get plenty of rest and maintain proper nutrition. Soups and  broths with crackers or rice are fine.  SEEK IMMEDIATE MEDICAL CARE IF:    You have severe headaches, shortness of breath, chest pain, neck pain, or an unusual rash.   You have uncontrolled vomiting, diarrhea, or you are unable to keep down fluids.   You or your child has an oral temperature above 102 F (38.9 C), not controlled by medicine.   Your baby is older than 3 months with a rectal temperature of 102 F (38.9 C) or higher.   Your baby is 31 months old or younger with a rectal temperature of 100.4 F (38 C) or higher.  MAKE SURE YOU:    Understand these instructions.   Will watch your condition.   Will get help right away if you are not doing well or get worse.  Document Released: 05/31/2005 Document Revised: 11/13/2011 Document Reviewed: 12/26/2010  [facility] Patient Information 2013 [place].

## 2012-12-14 LAB — STREP SCREEN GROUP A THROAT: Rapid Strep A Screen: NEGATIVE

## 2012-12-14 MED ORDER — ONDANSETRON 4 MG PO TBDP
4 MG | ORAL_TABLET | Freq: Three times a day (TID) | ORAL | Status: AC | PRN
Start: 2012-12-14 — End: 2012-12-16

## 2012-12-14 MED ADMIN — 0.9 % sodium chloride bolus: 1000 mL | INTRAVENOUS | @ 03:00:00 | NDC 00338004903

## 2012-12-14 MED ADMIN — ondansetron (ZOFRAN) injection 4 mg: INTRAVENOUS | @ 03:00:00 | NDC 00409475503

## 2012-12-14 MED FILL — SODIUM CHLORIDE 0.9 % IV SOLN: 0.9 % | INTRAVENOUS | Qty: 1000

## 2012-12-14 MED FILL — ONDANSETRON HCL 4 MG/2ML IJ SOLN: 4 MG/2ML | INTRAMUSCULAR | Qty: 2

## 2012-12-16 LAB — CULTURE, THROAT

## 2012-12-23 LAB — LIPID PANEL
Cholesterol, Total: 190 mg/dL (ref 0–199)
HDL: 42 mg/dL (ref >40)
LDL Calculated: 128 mg/dL — ABNORMAL HIGH (ref 0–99)
Triglycerides: 99 mg/dL (ref 0–149)
VLDL Cholesterol Calculated: 20 mg/dL

## 2012-12-23 LAB — COMPREHENSIVE METABOLIC PANEL
ALT: 19 U/L (ref 0–32)
AST: 22 U/L (ref 0–31)
Albumin: 4.2 g/dL (ref 3.5–5.2)
Alkaline Phosphatase: 48 U/L (ref 35–104)
BUN: 10 mg/dL (ref 6–20)
CO2: 20 mmol/L — ABNORMAL LOW (ref 22–29)
Calcium: 9.7 mg/dL (ref 8.6–10.2)
Chloride: 100 mmol/L (ref 98–107)
Creatinine: 1.1 mg/dL — ABNORMAL HIGH (ref 0.5–1.0)
GFR African American: >60
GFR Non-African American: >60 mL/min/{1.73_m2} (ref >=60)
Glucose: 79 mg/dL (ref 74–109)
Potassium: 3.3 mmol/L — ABNORMAL LOW (ref 3.5–5.0)
Sodium: 134 mmol/L (ref 132–146)
Total Bilirubin: 0.5 mg/dL (ref 0.0–1.2)
Total Protein: 7.6 g/dL (ref 6.4–8.3)

## 2012-12-23 LAB — CBC
Hematocrit: 39.7 % (ref 34.0–48.0)
Hemoglobin: 13.3 g/dL (ref 11.5–15.5)
MCH: 29.9 pg (ref 26.0–35.0)
MCHC: 33.4 % (ref 32.0–34.5)
MCV: 89.6 fL (ref 80.0–99.9)
MPV: 7.5 fL (ref 7.0–12.0)
Platelets: 341 E9/L (ref 130–450)
RBC: 4.43 E12/L (ref 3.50–5.50)
RDW: 13.8 fL (ref 11.5–15.0)
WBC: 7.9 E9/L (ref 4.5–11.5)

## 2012-12-23 LAB — HEMOGLOBIN A1C: Hemoglobin A1C: 5.8 % (ref 4.8–5.9)

## 2012-12-23 MED ORDER — HYDROCHLOROTHIAZIDE 12.5 MG PO CAPS
12.5 MG | ORAL_CAPSULE | Freq: Every day | ORAL | Status: DC
Start: 2012-12-23 — End: 2013-12-23

## 2012-12-23 MED ORDER — ALBUTEROL SULFATE HFA 108 (90 BASE) MCG/ACT IN AERS
108 (90 Base) MCG/ACT | Freq: Four times a day (QID) | RESPIRATORY_TRACT | Status: DC | PRN
Start: 2012-12-23 — End: 2012-12-25

## 2012-12-23 MED ORDER — ACETAZOLAMIDE 250 MG PO TABS
250 MG | ORAL_TABLET | Freq: Four times a day (QID) | ORAL | Status: DC
Start: 2012-12-23 — End: 2013-12-23

## 2012-12-23 MED ORDER — TRIAMCINOLONE ACETONIDE 0.025 % EX CREA
0.025 % | CUTANEOUS | Status: DC
Start: 2012-12-23 — End: 2013-01-17

## 2012-12-23 MED ORDER — TRIAMCINOLONE ACETONIDE 0.025 % EX CREA
0.025 % | CUTANEOUS | Status: DC
Start: 2012-12-23 — End: 2013-06-18

## 2012-12-23 MED ORDER — LISINOPRIL 10 MG PO TABS
10 MG | ORAL_TABLET | Freq: Every day | ORAL | Status: DC
Start: 2012-12-23 — End: 2013-12-23

## 2012-12-23 MED ORDER — FERROUS SULFATE 325 (65 FE) MG PO TABS
325 (65 Fe) MG | ORAL_TABLET | Freq: Every day | ORAL | Status: DC
Start: 2012-12-23 — End: 2016-10-30

## 2012-12-23 MED ORDER — RANITIDINE HCL 150 MG PO TABS
150 MG | ORAL_TABLET | Freq: Two times a day (BID) | ORAL | Status: DC
Start: 2012-12-23 — End: 2014-01-20

## 2012-12-23 NOTE — Telephone Encounter (Signed)
Pt called stated she needed refills of HCTZ. Filled.

## 2012-12-23 NOTE — Progress Notes (Signed)
Subjective:      Patient ID: Andrea Pitts is a 47 y.o. female.  CC :HTN ,rash on he neck .  HPI    Hypertension: Patient here for follow-up of elevated blood pressure. She is exercising and is adherent to low salt diet.  Blood pressure is well controlled at home. Cardiac symptoms none. Patient denies chest pain, exertional chest pressure/discomfort, irregular heart beat, lower extremity edema and paroxysmal nocturnal dyspnea.  Cardiovascular risk factors: hypertension, obesity (BMI >= 30 kg/m2) and sedentary lifestyle. Use of agents associated with hypertension: none. History of target organ damage: none.    Rash on her arm and neck ,very itchy from 3-4 days .  Tried HC cream but not helping   Daughter has eczema and on meds .denies fever ,chills ,new change in meds ,laundary detergents .    Her husband was recently diagnosed with hep C wants to get checked .      Review of Systems   Constitutional: Negative for fever, chills and unexpected weight change.   HENT: Negative for ear pain, sore throat and voice change.    Respiratory: Positive for cough. Negative for shortness of breath and wheezing.    Cardiovascular: Negative for chest pain and palpitations.   Gastrointestinal: Negative for vomiting, abdominal pain, diarrhea, constipation and blood in stool.   Genitourinary: Negative for dysuria.   Neurological: Negative for dizziness, weakness, numbness and headaches.       Objective:   Physical Exam   Constitutional: She appears well-developed and well-nourished. No distress.   HENT:   Head: Normocephalic and atraumatic.   Right Ear: External ear normal.   Left Ear: External ear normal.   Nose: Nose normal.   Mouth/Throat: Oropharynx is clear and moist. No oropharyngeal exudate.   Eyes: Conjunctivae are normal. Right eye exhibits no discharge. Left eye exhibits no discharge.   Neck: Neck supple. No thyromegaly present.   Cardiovascular: Normal rate, regular rhythm, normal heart sounds and intact distal pulses.   Exam reveals no gallop and no friction rub.    No murmur heard.  Pulmonary/Chest: Breath sounds normal. She has no wheezes.   Musculoskeletal: She exhibits no edema.   Lymphadenopathy:     She has no cervical adenopathy.   Skin:   Eczema on neck ,on forearms ,excoriations present      BP 132/82   Pulse 72   Temp(Src) 98.1 ??F (36.7 ??C) (Oral)   Resp 16   Ht 5\' 4"  (1.626 m)   Wt 287 lb (130.182 kg)   BMI 49.24 kg/m2   LMP 06/04/2009    Assessment/plan :   1.HTN :  -controlled .  -cont same meds .  -life style modifications .  -labs .    2.contacts dermatitis :  -kenalog cream .    HM :  -schedule for pap smear .  -mammogram .  Refused TDap shots .

## 2012-12-23 NOTE — Progress Notes (Signed)
FU HTN and rash    Taking medications as ordered   No complaints except itchy rash on r neck and arm    No fever or chills   Eczema in family   Husband has hep C    Blood pressure 132/82, pulse 72, temperature 98.1 ??F (36.7 ??C), temperature source Oral, resp. rate 16, height 5\' 4"  (1.626 m), weight 287 lb (130.182 kg), last menstrual period 06/04/2009.    HEENT WNL   Eczema of neck and right arm   Heart RR  Lungs clear    Continue same BP meds    Kenalog cream    Labs as ordered    Attending Physician Statement  I have discussed the case, including pertinent history and exam findings with the resident. I agree with the documented assessment and plan.

## 2012-12-23 NOTE — Telephone Encounter (Signed)
Pharmacy needs clarification on order that was sent over for the Kenalog cream.

## 2012-12-24 LAB — HEPATITIS PANEL, ACUTE
Hep A IgM: NONREACTIVE
Hep B Core Ab, IgM: NONREACTIVE
Hep B S Ag Interp: NONREACTIVE
Hep C Ab Interp: NONREACTIVE

## 2012-12-24 NOTE — Telephone Encounter (Signed)
Pharmacy called and they need a prior authorization on the proventil inhaler, they said also if you just wanted to change it to pro-air it shouldn't need a prior authorization.

## 2012-12-24 NOTE — Telephone Encounter (Signed)
Also the prior authorization number is [id number] if you would like to keep the proventil inhaler.

## 2012-12-25 MED ORDER — ALBUTEROL SULFATE HFA 108 (90 BASE) MCG/ACT IN AERS
108 (90 Base) MCG/ACT | Freq: Four times a day (QID) | RESPIRATORY_TRACT | Status: DC | PRN
Start: 2012-12-25 — End: 2013-09-01

## 2012-12-25 NOTE — Telephone Encounter (Signed)
Called patient, left message and let patient know that her script was at pharmacy.

## 2012-12-25 NOTE — Telephone Encounter (Signed)
I sent pro air to her pharmacy .  Please inform her .thanks

## 2012-12-26 MED ORDER — POTASSIUM CHLORIDE CRYS ER 10 MEQ PO TBCR
10 MEQ | ORAL_TABLET | Freq: Every day | ORAL | Status: DC
Start: 2012-12-26 — End: 2014-05-04

## 2013-01-15 NOTE — Telephone Encounter (Signed)
Needs a refill of cream for rash, could she have a larger tube, also needs flexeril refilled

## 2013-01-17 MED ORDER — TRIAMCINOLONE ACETONIDE 0.025 % EX CREA
0.025 % | CUTANEOUS | Status: DC
Start: 2013-01-17 — End: 2016-10-30

## 2013-02-07 MED ORDER — CLOBETASOL PROPIONATE 0.05 % EX OINT
0.05 % | CUTANEOUS | Status: DC
Start: 2013-02-07 — End: 2016-10-30

## 2013-02-07 MED ORDER — HYDROXYZINE HCL 25 MG PO TABS
25 MG | ORAL_TABLET | Freq: Three times a day (TID) | ORAL | Status: AC | PRN
Start: 2013-02-07 — End: 2013-02-17

## 2013-02-07 NOTE — Progress Notes (Signed)
GYn exam  Prior hysterectomy  No complaints  Mammogram UTD  BP 128/74   Pulse 78   Temp(Src) 98.7 ??F (37.1 ??C) (Oral)   Resp 16   Ht 5\' 3"  (1.6 m)   Wt 283 lb (128.368 kg)   BMI 50.14 kg/m2   LMP 06/04/2009  CV: S1S2 RRR no murmur  Resp CTAB no wheeze  GI soft NT/ND +bs  Extr no edema  Breast normal  GU normal, cervix absent  Imp menopause   Well female exam  Plan Pap obtained, but if normal can likely discontinue   Attending Physician Statement  I have discussed the case, including pertinent history and exam findings with the resident. I agree with the documented assessment and plan.

## 2013-02-07 NOTE — Progress Notes (Deleted)
Subjective:      Patient ID: Andrea Pitts is a 47 y.o. female.    HPI    Review of Systems    Objective:   Physical Exam    Assessment:      ***      Plan:      ***

## 2013-02-07 NOTE — Progress Notes (Signed)
Chief complaint :47 year- old presents for well woman exam.     Present illness :    G3, P2,Ab1  Doing well.  Periods: hysterectomy .  Sexually active : yes.  No abdominal or pelvic pain. No dyspareunia.  No abnormal vaginal  discharge.  Previous Pap smears normal. Last Pap smear :none after hysterectomy   No urinary or GI symptoms.  Medical/ surgical history and medications reviewed.    Examination :  Vital signs reviewed.  GA : Healthy.  HEENT : Normal.  Neck : Supple.  Thyroid : normal, no goiter.  Breasts : no masses. No skin changes or lymphadenopathy. No nipple discharge.  Abdomen :Soft. No masses. No organomegaly.  V&V : Normal female external genitalia.  BUS: Normal.  PS : Adequate.  Cervix : No lesions, pap smear obtained from vault .  Uterus : Normal size.  Adnexa : Free, no masses.          Assessment :  Well-woman      Plan:   Pap smear from vaginal   vault obtained ,if it is normal ,no more PAps .  Mammogram : normal     The patient was informed about the importance of regular gynecological  examination and pap smear. She was also  informed of the need for yearly mammogram after the age of 60.  After age 61 ,a colonoscopy should be scheduled through her primary care physician (PCP). This will help decrease the risk of colon cancer.  During the reproductive years, she should take folic acid daily in order to decrease the risk of neural tube defects such as spina bifida.  Adequate calcium and vitamin D intake should be added to a healthy diet ,and weight bearing exercise continued daily for improved cardiovascular and bone health.  The patient was reminded of the importance of safe sexual practices including a mutually monogamous relationship and the use of  barrier contraception.  All vaccinations such as flu, tetanus, gardasil ( for cervical cancer ), meningitis, pneumonia and shingles should be updated by her PCP.  All questions have been answered.    F/u in 3 months

## 2013-03-15 ENCOUNTER — Inpatient Hospital Stay: Admit: 2013-03-15 | Discharge: 2013-03-16 | Discharge status: Against Medical Advice | Attending: Emergency Medicine

## 2013-03-15 MED ADMIN — morphine (PF) injection 4 mg: 4 mg | INTRAMUSCULAR | NDC 00409189101

## 2013-03-15 MED FILL — MORPHINE SULFATE (PF) 4 MG/ML IV SOLN: 4 mg/mL | INTRAVENOUS | Qty: 1

## 2013-03-15 NOTE — ED Notes (Signed)
Patient left ER without discharge papers. Family escorted patient out. Patient rates back pain 10/10. Patient ambulated with minimal assistance.     Juanita Laster, RN  03/15/13 [phone]
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
## 2013-04-02 MED ORDER — CYCLOBENZAPRINE HCL 10 MG PO TABS
10 MG | ORAL_TABLET | Freq: Three times a day (TID) | ORAL | Status: AC | PRN
Start: 2013-04-02 — End: 2013-04-12

## 2013-04-02 MED ORDER — MELOXICAM 7.5 MG PO TABS
7.5 MG | ORAL_TABLET | Freq: Every day | ORAL | Status: DC
Start: 2013-04-02 — End: 2013-06-18
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
## 2013-06-18 ENCOUNTER — Inpatient Hospital Stay: Admit: 2013-06-18 | Discharge: 2013-06-19 | Discharge status: Against Medical Advice | Attending: Emergency Medicine

## 2013-06-18 MED ORDER — AZITHROMYCIN 250 MG PO TABS
250 MG | PACK | ORAL | Status: AC
Start: 2013-06-18 — End: 2013-06-28

## 2013-06-18 MED ORDER — GUAIFENESIN-CODEINE 100-10 MG/5ML PO SYRP
100-10 MG/5ML | Freq: Four times a day (QID) | ORAL | Status: AC | PRN
Start: 2013-06-18 — End: 2013-06-25

## 2013-06-18 MED ORDER — PREDNISONE (PAK) 10 MG PO TABS
10 MG | ORAL_TABLET | ORAL | Status: AC
Start: 2013-06-18 — End: 2013-06-28

## 2013-06-18 MED ORDER — MUCINEX DM MAXIMUM STRENGTH 60-1200 MG PO TB12
60-1200 MG | ORAL_TABLET | Freq: Two times a day (BID) | ORAL | Status: DC
Start: 2013-06-18 — End: 2013-09-01

## 2013-06-18 MED ADMIN — methylPREDNISolone sodium (SOLU-MEDROL) injection 125 mg: 125 mg | INTRAMUSCULAR | @ 23:00:00 | NDC 00009004725

## 2013-06-18 MED FILL — SOLU-MEDROL 125 MG IJ SOLR: 125 MG | INTRAMUSCULAR | Qty: 125

## 2013-06-18 NOTE — ED Notes (Signed)
Pt requesting a breathing tx, oncoming nurse aware of such.    Bayard Beaver, RN  06/18/13 1910

## 2013-06-18 NOTE — ED Provider Notes (Signed)
HPI:  06/18/2013, Time: 6:41 PM.       Andrea Pitts is a 47 y.o. female presenting to the ED for cough, beginning 1 week ago.  The complaint has been persistent, mild in severity, and worsened by nothing.  The patient's history is provided by the patient. Associated symptoms include coughing up sputum, sore throat, rhinorrhea, and chills. Pt denies any fever. Pt has a hx of asthma. Pt's mother is sick at home.    ROS:   Unless otherwise stated in this report or unable to obtain because of the patient's clinical or mental status as evidenced by the medical record, this patients's positive and negative responses for Review of Systems, constitutional, psych, eyes, ENT, cardiovascular, respiratory, gastrointestinal, neurological, genitourinary, musculoskeletal, integument systems and systems related to the presenting problem are either stated in the preceding or were not pertinent or were negative for the symptoms and/or complaints related to the medical problem.    ----------------------------------------------PHYSICAL EXAM--------------------------------------------    Constitutional: She appears well-developed and well-nourished.  No acute distress.  Head: Normocephalic and atraumatic.   Eyes: Conjunctivae are normal. PERRL.  Ears:   Right TM is normal.  Left TM is normal.   Nose:  Nares are patent.  Throat:  Mucous membranes are moist.   Posterior pharynx is erythematic, no exudates.   Neck: Supple.   Cardiovascular: Regular rate.  Regular rhythm.  Heart sounds normal.  Distal pulses intact.  Pulmonary/Chest: No respiratory distress. Bilateral expiratory wheezes.   Abdominal: Soft. There is no tenderness. No guarding or rebound.  Musculoskeletal: No edema.   Skin: Warm and dry.   Neurological: She is alert and oriented.      --------------------------------------------- PROCEDURES ----------------------------------------------  None.    --------------------------------------------- PAST HISTORY  ---------------------------------------------  Past Medical History:  has a past medical history of Hypertension; Low vision both eyes; Pseudotumor cerebri; and Asthma.    Past Surgical History:  has past surgical history that includes Cholecystectomy; Hysterectomy; and Tonsillectomy.    Social History:  reports that she has never smoked. She does not have any smokeless tobacco history on file. She reports that she does not drink alcohol.    Family History: family history is not on file.     The patient???s home medications have been reviewed.    Allergies: Penicillins      -------------------------------------------------- RESULTS -------------------------------------------------  LABS:  No results found for this visit on 06/18/13.    RADIOLOGY:  Interpreted by Radiologist.  XR CHEST STANDARD TWO VW    Final Result: IMPRESSION: No acute cardiopulmonary process.           ------------------------- NURSING NOTES AND VITALS REVIEWED ---------------------------     The nursing notes within the ED encounter and vital signs as below have been reviewed.   BP 140/94   Pulse 80   Temp(Src) 98.8 ??F (37.1 ??C) (Oral)   Resp 16   Ht 5\' 4"  (1.626 m)   Wt 270 lb (122.471 kg)   BMI 46.32 kg/m2   SpO2 98%   LMP 06/04/2009  Oxygen Saturation Interpretation: Normal    ------------------------------------------ PROGRESS NOTES ------------------------------------------     The emergency provider has spoken with the patient and discussed today???s results, in addition to providing specific details for the plan of care and counseling regarding the diagnosis and prognosis.  Questions are answered at this time and they are agreeable with the plan.    --------------------------------- ADDITIONAL PROVIDER NOTES ---------------------------------  Medication during the patient's ED visit:  Medications  methylPREDNISolone sodium (SOLU-MEDROL) injection 125 mg (125 mg Intramuscular Given 06/18/13 1905)      This patient is stable for discharge.   The emergency provider has shared the specific conditions for return, as well as the importance of follow-up.      --------------------------------- IMPRESSION AND DISPOSITION ---------------------------------    IMPRESSION  1. Exacerbation of intermittent asthma        DISPOSITION  Disposition: Discharge to home  Patient condition is stable    SCRIBE ATTESTATION  06/18/2013, 6:41 PM.    This note is prepared by Si Gaul, acting as Scribe for Evonnie Pat, MD.    Evonnie Pat, MD:  The scribe's documentation has been prepared under my direction and personally reviewed by me in its entirety.  I confirm that the note above accurately reflects all work, treatment, procedures, and medical decision making performed by me.               Evonnie Pat, MD  06/20/13 1012

## 2013-06-18 NOTE — ED Notes (Signed)
Departed with no resp distress.  States she is feeling much better.   Lung sounds clear throughout all fields.    Wendi Snipes, RN  06/18/13 2052

## 2013-06-18 NOTE — Discharge Instructions (Signed)
Asthma in Adults: After Your Visit  Your Care Instructions     During an asthma attack, your airways swell and narrow as a reaction to certain things (triggers). This makes it hard to breathe.  You may be able to prevent asthma attacks if you avoid the things that set off your asthma symptoms. Keeping your asthma under control and treating symptoms before they get bad can help you avoid severe attacks.  If you can control your asthma, you may be able to do all of your normal daily activities. You may also avoid asthma attacks and trips to the hospital.  Follow-up care is a key part of your treatment and safety. Be sure to make and go to all appointments, and call your doctor if you are having problems. It's also a good idea to know your test results and keep a list of the medicines you take.  How can you care for yourself at home?   Follow your asthma action plan so you can manage your symptoms at home. An asthma action plan will help you prevent and control airway reactions and will tell you what to do during an asthma attack. If you do not have an asthma action plan, work with your doctor to build one.   Take your asthma medicine exactly as prescribed. Medicine plays an important role in controlling asthma. Talk to your doctor right away if you have any questions about what to take and how to take it.   Use your quick-relief medicine when you have symptoms of an attack. Quick-relief medicine often is an albuterol inhaler. Some people need to use quick-relief medicine before they exercise.   Take your controller medicine every day, not just when you have symptoms. Controller medicine is usually an inhaled corticosteroid. The goal is to prevent problems before they occur. Do not use your controller medicine to try to treat an attack that has already started. It does not work fast enough to help.   If your doctor prescribed corticosteroid pills to use during an attack, take them as directed. They may take  hours to work, but they may shorten the attack and help you breathe better.   Keep your quick-relief medicine with you at all times.   Talk to your doctor before using other medicines. Some medicines, such as aspirin, can cause asthma attacks in some people.   Learn how to use a peak flow meter to check how well you are breathing. This can help you predict when an asthma attack is going to occur. Then you can take medicine to prevent the asthma attack or make it less severe.   See your doctor regularly. These visits will help you learn more about asthma and what you can do to control it. Your doctor will monitor your treatment to make sure the medicine is helping you.   Keep track of your asthma attacks and your treatment. After you have had an attack, write down what triggered it, what helped end it, and any concerns you have about your asthma action plan. Take your diary when you see your doctor. You can then review your asthma action plan and decide if it is working.   Take your peak flow meter with you when you visit your doctor. Together, you can review the way you use it.   Do not smoke or allow others to smoke around you. Avoid smoky places. Smoking makes asthma worse. If you need help quitting, talk to your doctor about stop-smoking programs and   medicines. These can increase your chances of quitting for good.   Learn what triggers an asthma attack for you, and avoid the triggers when you can. Common triggers include colds, smoke, air pollution, dust, pollen, mold, pets, cockroaches, stress, and cold air.   Avoid colds and the flu. Get a pneumococcal vaccine shot. If you have had one before, ask your doctor whether you need a second dose. Get a flu vaccine every fall. If you must be around people with colds or the flu, wash your hands often.  When should you call for help?  Call 911 anytime you think you may need emergency care. For example, call if:   You have severe trouble breathing.  Call your  doctor now or seek immediate medical care if:   Your symptoms do not get better after you have followed your asthma action plan.   You cough up yellow, dark brown, or bloody mucus (sputum).  Watch closely for changes in your health, and be sure to contact your doctor if:   Your coughing and wheezing get worse.   You need to use quick-relief medicine on more than 2 days a week (unless it is just for exercise).   You need help figuring out what is triggering your asthma attacks.   Where can you learn more?   Go to [email or web address] and sign in to your MyChart account. Enter P597 in the Search Health Information box to learn more about "Asthma in Adults: After Your Visit."    If you do not have an account, please click on the "Sign Up Now" link.      2006-2014 [employer]. Care instructions adapted under license by [facility]. This care instruction is for use with your licensed healthcare professional. If you have questions about a medical condition or this instruction, always ask your healthcare professional. [employer] disclaims any warranty or liability for your use of this information.  Content Version: 10.1.311062; Current as of: June 10, 2012

## 2013-06-19 MED ADMIN — ipratropium-albuterol (DUONEB) nebulizer solution 1 ampule: 1 | RESPIRATORY_TRACT | @ 01:00:00 | NDC 00487020101

## 2013-06-19 MED FILL — IPRATROPIUM-ALBUTEROL 0.5-2.5 (3) MG/3ML IN SOLN: RESPIRATORY_TRACT | Qty: 3

## 2013-09-01 ENCOUNTER — Inpatient Hospital Stay
Admit: 2013-09-01 | Discharge: 2013-09-01 | Disposition: A | Discharge status: Home / Self Care | Attending: Emergency Medicine

## 2013-09-01 MED ORDER — ALBUTEROL SULFATE (2.5 MG/3ML) 0.083% IN NEBU
PACK | RESPIRATORY_TRACT | Status: DC | PRN
Start: 2013-09-01 — End: 2014-04-02

## 2013-09-01 MED ORDER — IPRATROPIUM BROMIDE HFA 17 MCG/ACT IN AERS
17 MCG/ACT | Freq: Four times a day (QID) | RESPIRATORY_TRACT | Status: DC
Start: 2013-09-01 — End: 2013-11-04

## 2013-09-01 MED ORDER — FLUTICASONE-SALMETEROL 250-50 MCG/DOSE IN AEPB
250-50 MCG/DOSE | Freq: Two times a day (BID) | RESPIRATORY_TRACT | Status: DC
Start: 2013-09-01 — End: 2013-11-04

## 2013-09-01 MED ORDER — PREDNISONE 20 MG PO TABS
20 MG | ORAL_TABLET | ORAL | Status: DC
Start: 2013-09-01 — End: 2013-09-10

## 2013-09-01 MED ORDER — IPRATROPIUM BROMIDE 0.06 % NA SOLN
0.06 % | Freq: Three times a day (TID) | NASAL | Status: DC | PRN
Start: 2013-09-01 — End: 2014-05-04

## 2013-09-01 MED ORDER — MONTELUKAST SODIUM 10 MG PO TABS
10 MG | ORAL_TABLET | Freq: Every evening | ORAL | Status: DC
Start: 2013-09-01 — End: 2013-11-04

## 2013-09-01 MED ORDER — ALBUTEROL SULFATE HFA 108 (90 BASE) MCG/ACT IN AERS
108 (90 Base) MCG/ACT | Freq: Four times a day (QID) | RESPIRATORY_TRACT | Status: DC | PRN
Start: 2013-09-01 — End: 2014-04-02

## 2013-09-01 MED ORDER — COOL MIST HUMIDIFIER MISC
Freq: Every day | Status: DC | PRN
Start: 2013-09-01 — End: 2016-10-30

## 2013-09-01 MED ORDER — IPRATROPIUM BROMIDE 0.02 % IN SOLN
0.02 % | Freq: Four times a day (QID) | RESPIRATORY_TRACT | Status: DC
Start: 2013-09-01 — End: 2014-05-04

## 2013-09-01 MED ADMIN — ipratropium-albuterol (DUONEB) nebulizer solution 1 ampule: 1 | RESPIRATORY_TRACT | @ 21:00:00 | NDC 00487020101

## 2013-09-01 MED ADMIN — predniSONE (DELTASONE) tablet 60 mg: 60 mg | ORAL | @ 21:00:00 | NDC 00143973801

## 2013-09-01 MED FILL — IPRATROPIUM-ALBUTEROL 0.5-2.5 (3) MG/3ML IN SOLN: RESPIRATORY_TRACT | Qty: 3

## 2013-09-01 MED FILL — IPRATROPIUM-ALBUTEROL 0.5-2.5 (3) MG/3ML IN SOLN: RESPIRATORY_TRACT | Qty: 6

## 2013-09-01 MED FILL — PREDNISONE 20 MG PO TABS: 20 MG | ORAL | Qty: 3

## 2013-09-01 NOTE — Discharge Instructions (Signed)
Asthma Attack: After Your Visit  Your Care Instructions     During an asthma attack, the airways swell and narrow. This makes it hard to breathe. Severe asthma attacks can be life-threatening, but you can help prevent them by keeping your asthma under control and treating symptoms before they get bad. This can also help you avoid future trips to the emergency room.  The doctor has checked you carefully, but problems can develop later. If you notice any problems or new symptoms, get medical treatment right away.  Follow-up care is a key part of your treatment and safety. Be sure to make and go to all appointments, and call your doctor if you are having problems. It's also a good idea to know your test results and keep a list of the medicines you take.  How can you care for yourself at home?   Follow your asthma action plan to prevent and treat attacks. If you don't have an asthma action plan, work with your doctor to create one.   Take your asthma medicines exactly as prescribed. Talk to your doctor right away if you have any questions about how to take them.   Use your quick-relief medicine when you have symptoms of an attack. Quick-relief medicine is usually an albuterol inhaler. Some people need to use quick-relief medicine before they exercise.   Take your controller medicine every day, not just when you have symptoms. Controller medicine is usually an inhaled corticosteroid. The goal is to prevent problems before they occur. Don't use your controller medicine to treat an attack that has already started. It doesn't work fast enough to help.   If your doctor prescribed corticosteroid pills to use during an attack, take them exactly as prescribed. It may take hours for the pills to work, but they may make the episode shorter and help you breathe better.   Keep your quick-relief medicine with you at all times.   Talk to your doctor before using other medicines. Some medicines, such as aspirin, can cause  asthma attacks in some people.   Learn how to use a peak flow meter to check how well you are breathing. This can help you predict when an asthma attack is going to occur. Then you can take medicine to prevent the asthma attack or make it less severe.   Do not smoke or allow others to smoke around you. Avoid smoky places. Smoking makes asthma worse. If you need help quitting, talk to your doctor about stop-smoking programs and medicines. These can increase your chances of quitting for good.   Learn what triggers an asthma attack for you, and avoid the triggers when you can. Common triggers include colds, smoke, air pollution, dust, pollen, mold, pets, cockroaches, stress, and cold air.   Avoid colds and the flu. Get a pneumococcal vaccine shot. If you have had one before, ask your doctor if you need a second dose. Get a flu vaccine every fall. If you must be around people with colds or the flu, wash your hands often.  When should you call for help?  Call 911 anytime you think you may need emergency care. For example, call if:   You have severe trouble breathing.  Call your doctor now or seek immediate medical care if:   Your symptoms do not get better after you have followed your asthma action plan.   You have new or worse trouble breathing.   Your coughing and wheezing get worse.   You cough up dark  brown or bloody mucus (sputum).   You have a new or higher fever.  Watch closely for changes in your health, and be sure to contact your doctor if:   You need to use quick-relief medicine on more than 2 days a week (unless it is just for exercise).   You cough more deeply or more often, especially if you notice more mucus or a change in the color of your mucus.   You are not getting better as expected.   Where can you learn more?   Go to [email or web address] and sign in to your MyChart account. Enter [phone] in the [place] box to learn more about "Asthma Attack: After Your  Visit."    If you do not have an account, please click on the "Sign Up Now" link.      2006-2014 Healthwise, Inc[employer]. Care instructions adapted under license by [facility]. This care instruction is for use with your licensed healthcare professional. If you have questions about a medical condition or this instruction, always ask your healthcare professional. [place] any warranty or liability for your use of this information.  Content Version: 10.3.368381; Current as of: May 13, 2013                Learning About Asthma Triggers  What are asthma triggers?     When you have asthma, certain things can make your symptoms worse. These are called triggers. Learn what triggers an asthma attack for you, and avoid the triggers when you can. Common triggers include colds, smoke, air pollution, dust, pollen, pets, stress, and cold air.  How do asthma triggers affect you?  Triggers can make it harder for your lungs to work as they should. They can lead to sudden breathing problems and other symptoms. When you are around a trigger, an asthma attack is more likely. If your symptoms are severe, you may need emergency treatment or have to go to the hospital for treatment.  What can you do to avoid triggers?  The first thing is to know your triggers.  When you are having symptoms, note the things around you that might be causing them. Then look for patterns that may be triggering your symptoms. Record your triggers on a piece of paper or in an asthma diary. When you have your list of possible triggers, work with your doctor to find ways to avoid them.  Avoid colds and flu. Get a pneumococcal vaccine shot. If you have had one before, ask your doctor whether you need a second dose. Get a flu vaccine every year, as soon as it's available. If you must be around people with colds or the flu, wash your hands often.  Here are some ways to avoid a few common triggers.   Do not smoke or allow others to  smoke around you. If you need help quitting, talk to your doctor about stop-smoking programs and medicines. These can increase your chances of quitting for good.   If there is a lot of pollution, pollen, or dust outside, stay at home and keep your windows closed. Use an air conditioner or air filter in your home. Check your local weather report or newspaper for air quality and pollen reports.  What else should you know?   Take your controller medicine every day, not just when you have symptoms. It helps prevent problems before they occur.   Your doctor may suggest that you check how well your lungs are working by measuring your  peak expiratory flow (PEF) throughout the day. Your PEF may drop when you are near things that trigger symptoms.   Where can you learn more?   Go to [email or web address] and sign in to your MyChart account. Enter [id number] in the [place] box to learn more about "Learning About Asthma Triggers."    If you do not have an account, please click on the "Sign Up Now" link.      2006-2014 Healthwise, Incorporated. Care instructions adapted under license by [facility]. This care instruction is for use with your licensed healthcare professional. If you have questions about a medical condition or this instruction, always ask your healthcare professional. [place] any warranty or liability for your use of this information.  Content Version: 10.3.368381; Current as of: May 13, 2013                Asthma: Your Chief Strategy Officer medicine action plan  Fill in the blank spaces and boxes that apply for all sections.    Name of your controller medicine:   ____________________________________________   How much of this medicine do you take?   ____________________________________________   How often do you take this medicine?   ____________________________________________   Other  instructions?   ____________________________________________  Quick-relief medicine action plan   Name of your quick-relief medicine:   ____________________________________________   How much of this medicine do you take?   ____________________________________________   How often do you take this medicine?   ____________________________________________  Asthma Zones  GREEN ZONE: This is where you want to be!   Green zone symptoms    You have no shortness of breath or chest tightness. You are not coughing or wheezing.   You can do all of your usual activities.   You sleep well at night.  Green zone peak flow (if you use a peak flow meter)   ______ or more (80% or more of your personal best)  Green zone actions (Check the boxes and fill in the blank spaces that apply.)  [ ]$  You take your controller medicine(s) every day.  [ ]$  You are staying away from your asthma triggers.  [ ]$  You take quick-relief medicine (called _____________________) ______ minutes before exercise.  YELLOW ZONE: Your asthma is getting worse.   Yellow zone symptoms    You are short of breath or have chest tightness. You are coughing or wheezing.   You have symptoms that keep you up at night.   You can do some, but not all, of your usual activities.  Yellow zone peak flow (if you use a peak flow meter)   ______ to ______ (50% to 79% of your personal best)  Yellow zone actions (Check the boxes and fill in the blank spaces that apply.)  [ ]$  Take _____ puff(s) of quick-relief medicine called ______________________. Repeat _____ times.  [ ]$  If your symptoms don't get better or your peak flow has not returned to the green zone in 1 hour, then:   [ ]$  Take _____ puff(s) of medicine called ______________________. Take it ____ times a day.   [ ]$  Begin or increase treatment with corticosteroid pills. Take ______ mg of medicine called ____________________________ every __________.   [ ]$  Call your doctor at this number:  ____________________.  RED ZONE: Danger!   Red zone symptoms    You are very short of breath.   You can't do your usual activities.   Quick-relief medicine doesn't  help. Or your symptoms don't get better after 24 hours in the yellow zone.  Red zone peak flow (if you use a peak flow meter)   Less than _______ (less than 50% of your personal best)  Red zone actions (Check the boxes and fill in the blank spaces that apply.)  [ ]$  Take _____ puff(s) of quick-relief medicine called ____________________________. Repeat ______ times.  [ ]$  Begin or increase treatment with corticosteroid pills. Take ________ mg now.  [ ]$  Call your doctor at this number: _________________. If you can't contact your doctor, go to the emergency department. Call 911 or ___________________.  [ ]$  Other numbers you might call are: ___________________________________.  When should you call for help?  Call 911 anytime you think you may need emergency care. For example, call if:   You have severe trouble breathing.  Call your doctor now or seek immediate medical care if:   Your symptoms do not get better after you've followed your asthma action plan.   You've used your quick-relief medicine but are still having trouble breathing.   You cough up blood.   You have new or worse trouble breathing.   You cough up dark brown or bloody mucus (sputum).  Watch closely for changes in your health, and be sure to contact your doctor if:   You need to use quick-relief medicine more than 2 days each week (unless it's just for exercise).   Your coughing and wheezing get worse.  Follow-up care is a key part of your treatment and safety. Be sure to make and go to all appointments, and call your doctor if you are having problems. It's also a good idea to know your test results and keep a list of the medicines you take.   Where can you learn more?   Go to [email or web address] and sign in to your MyChart account. Enter H178 in the [place] box to learn more about "Asthma: Your Action Plan."    If you do not have an account, please click on the "Sign Up Now" link.      2006-2014 Healthwise, Incorporated. Care instructions adapted under license by [facility]. This care instruction is for use with your licensed healthcare professional. If you have questions about a medical condition or this instruction, always ask your healthcare professional. [place] any warranty or liability for your use of this information.  Content Version: 10.3.368381; Current as of: May 13, 2013

## 2013-09-01 NOTE — ED Provider Notes (Addendum)
Patient is a 47 y.o. female presenting with cough. The history is provided by the patient.   Cough  Cough characteristics:  Non-productive  Severity:  Moderate  Onset quality:  Gradual  Duration:  4 weeks  Timing:  Constant  Progression:  Worsening  Chronicity:  New  Smoker: no    Context: upper respiratory infection and weather changes    Context: not animal exposure, not exposure to allergens, not fumes, not occupational exposure, not sick contacts, not smoke exposure and not with activity    Relieved by:  Nothing  Worsened by:  Deep breathing and lying down  Ineffective treatments:  None tried  Associated symptoms: wheezing    Associated symptoms: no chest pain, no chills, no diaphoresis, no ear fullness, no ear pain, no eye discharge, no fever, no headaches, no myalgias, no rash, no rhinorrhea, no shortness of breath, no sinus congestion, no sore throat and no weight loss    Risk factors: recent infection    Risk factors: no chemical exposure and no recent travel    Risk factors comment:  History of asthma      Review of Systems   Constitutional: Negative for fever, chills, weight loss and diaphoresis.   HENT: Negative for congestion, ear pain, postnasal drip, rhinorrhea, sinus pressure, sore throat, trouble swallowing and voice change.    Eyes: Negative for pain, discharge and redness.   Respiratory: Positive for cough and wheezing. Negative for shortness of breath.    Cardiovascular: Negative for chest pain.   Gastrointestinal: Negative for nausea, vomiting, abdominal pain, diarrhea and abdominal distention.   Genitourinary: Negative for dysuria, frequency and hematuria.   Musculoskeletal: Negative for myalgias, back pain and arthralgias.   Skin: Negative for rash and wound.   Neurological: Negative for weakness and headaches.   Hematological: Negative for adenopathy.   All other systems reviewed and are negative.        Physical Exam   Constitutional: She is oriented to person, place, and time. She appears  well-developed and well-nourished.   HENT:   Head: Normocephalic and atraumatic.   Right Ear: External ear normal.   Left Ear: External ear normal.   Nose: Nose normal.   Mouth/Throat: Oropharynx is clear and moist. No oropharyngeal exudate.   Eyes: Conjunctivae and EOM are normal. Pupils are equal, round, and reactive to light. Right eye exhibits no discharge. Left eye exhibits no discharge. No scleral icterus.   Neck: Normal range of motion. Neck supple. No JVD present. No tracheal deviation present.   Cardiovascular: Normal rate, regular rhythm, normal heart sounds and intact distal pulses.    No murmur heard.  Pulmonary/Chest: Effort normal. No stridor. No respiratory distress. She has wheezes. She has no rales.   Abdominal: Soft. Bowel sounds are normal. There is no tenderness. There is no rebound and no guarding.   Musculoskeletal: She exhibits no edema.   Lymphadenopathy:     She has no cervical adenopathy.   Neurological: She is alert and oriented to person, place, and time. She has normal strength. No cranial nerve deficit or sensory deficit. Coordination normal. GCS eye subscore is 4. GCS verbal subscore is 5. GCS motor subscore is 6.   Skin: Skin is warm and dry. No rash noted. No erythema. No pallor.   Nursing note and vitals reviewed.      Procedures    MDM    --------------------------------------------- PAST HISTORY ---------------------------------------------  Past Medical History:  has a past medical history of Hypertension; Low vision  both eyes; Pseudotumor cerebri; and Asthma.    Past Surgical History:  has past surgical history that includes Cholecystectomy; Hysterectomy; and Tonsillectomy.    Social History:  reports that she has never smoked. She does not have any smokeless tobacco history on file. She reports that she does not drink alcohol.    Family History: family history is not on file.     The patient???s home medications have been reviewed.    Allergies:  Penicillins    -------------------------------------------------- RESULTS -------------------------------------------------  No results found for this visit on 09/01/13.  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   Xr Chest Standard Two Vw    09/01/2013   Chest 2 views   Indications: Cough   Comparisons are 06/18/2013 PA and lateral films show the lung fields to be clear .  No pleural fluid is seen.  The cardiovascular silhouette appears normal . The aorta is tortuous and ectatic.      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      ------------------------- NURSING NOTES AND VITALS REVIEWED ---------------------------   The nursing notes within the ED encounter and vital signs as below have been reviewed.   BP 120/90   Pulse 80   Temp(Src) 98.9 ??F (37.2 ??C) (Oral)   Resp 16   Ht 5\' 4"  (1.626 m)   Wt 280 lb (127.007 kg)   BMI 48.04 kg/m2   SpO2 99%   LMP 06/04/2009  Oxygen Saturation Interpretation: Normal      ------------------------------------------ PROGRESS NOTES ------------------------------------------   I have spoken with the patient and family and discussed today???s results, in addition to providing specific details for the plan of care and counseling regarding the diagnosis and prognosis.  Their questions are answered at this time and they are agreeable with the plan.      --------------------------------- ADDITIONAL PROVIDER NOTES ---------------------------------    ED Course Medications:                Medications   ipratropium-albuterol (DUONEB) nebulizer solution 1 ampule (1 ampule Inhalation Given 09/01/13 1614)   predniSONE (DELTASONE) tablet 60 mg (60 mg Oral Given 09/01/13 1548)        Time: 16:38  Re-evaluation.  Patient???s symptoms are improving  Repeat physical examination is significantly improved with improved airflow and decreased wheezing     This  patient is stable for discharge.  I have shared the specific conditions for return, as well as the importance of follow-up.      CLINICAL IMPRESSION:  1. Asthma exacerbation attacks, severe persistent              Bethel Sirois Dorena Bodo, MD  09/01/13 1638    Levenia Skalicky Dorena Bodo, MD  09/06/13 [phone]

## 2013-09-10 NOTE — Progress Notes (Signed)
Subjective:      Patient ID: Andrea Pitts is a 48 y.o. female.    HPI:  She had a persistent cough for greater than 1 month ago, with SOB, wheezing and cough.  Went to ED whre she was given a DuoNeb and was given 5 days of prednisone.  She has also been using a nasal spray every day.  Better since ED.  Cough is improved but has not gone away completely.  Denies FCNV.  Eating well.  She has been using nebulizer 4 times a day, and albuterol 4 times a day between.  She does think the albuterol is helping her.   Cough is nonproductive.  No fever or chills.  No other complaints at this time.    Review of Systems   Constitutional: Negative for fever and chills.   Respiratory: Positive for cough, chest tightness, shortness of breath and wheezing. Negative for stridor.    Cardiovascular: Negative for chest pain, palpitations and leg swelling.   Gastrointestinal: Negative for nausea, vomiting, diarrhea and constipation.   Genitourinary: Negative for dysuria, hematuria and difficulty urinating.   Skin: Negative for color change and rash.   Neurological: Negative for dizziness, light-headedness and headaches.       Objective:  BP 138/90   Pulse 97   Temp(Src) 98.6 ??F (37 ??C) (Oral)   Resp 17   Ht 5\' 4"  (1.626 m)   Wt 294 lb (133.358 kg)   BMI 50.44 kg/m2   LMP 06/04/2009    Physical Exam   Constitutional: She is oriented to person, place, and time. She appears well-developed and well-nourished. No distress.   HENT:   Head: Normocephalic and atraumatic.   Nose: Nose normal.   Mouth/Throat: Oropharynx is clear and moist. No oropharyngeal exudate.   Eyes: EOM are normal. No scleral icterus.   Neck: Normal range of motion. Neck supple. Carotid bruit is not present. No thyromegaly present.   Cardiovascular: Normal rate, regular rhythm, normal heart sounds and intact distal pulses.  Exam reveals no gallop and no friction rub.    No murmur heard.  Pulmonary/Chest: Effort normal. No respiratory distress. She has wheezes (faint  end-expiratory wheezes bilaterally). She has no rales.   Abdominal: Soft. Bowel sounds are normal. She exhibits no distension. There is no tenderness.   Musculoskeletal: She exhibits no edema.   Lymphadenopathy:     She has no cervical adenopathy.   Neurological: She is alert and oriented to person, place, and time.   Skin: Skin is warm and dry. No rash noted.   Psychiatric: She has a normal mood and affect. Her behavior is normal. Judgment and thought content normal.       Assessment:      Philis PiqueLaticia was seen today for no specified reason.    Diagnoses and associated orders for this visit:    Needs flu shot  - FLU VACCINE => 3 YEARS IM QUADRIVALENT    Asthma              Plan:      The patient was advised to start to wean down her albuterol and nebulizers slowly to her pre-morbid levels.  She was advised that if she decreases her use and her asthma starts to flare again to call or come in to be evaluated.  The patient was strongly advised to get the flu shot, which she did.  Health maintenance issues were reviewed, and the patient is due for a mammogram.  The patient wanted to  check with her insurance to see if she can get one before this was ordered.  The patient will return to the office to follow up for her other medical issues in 3 month.  As above  Call if symptoms worsen or persist  Follow up in 3 months.  Side effects of any new medications discussed with patient  Patient voiced understanding and agreement with above assessment, plan and counseling.  All questions answered.

## 2013-09-10 NOTE — Progress Notes (Signed)
47yoF with asthma exacerbation seen in [facility] recently, treated with prednisone and albuterol inhalers q4H.  Today she is improved, wheezing resolved, no F/C.    BP 138/90   Pulse 97   Temp(Src) 98.6 ??F (37 ??C) (Oral)   Resp 17   Ht 5\' 4"  (1.626 m)   Wt 294 lb (133.358 kg)   BMI 50.44 kg/m2   LMP 06/04/2009  AAO/NAD  RRR no m  CTAB no wheeze, good air movement  LE with ttp/cce    A/P  1) Asthma - begin to wean the inhalers  2) DECLINES FLU SHOT  3) Recommend mammo  4) RTO 520m    Attending Physician Statement  I have discussed the case, including pertinent history and exam findings with the resident. I agree with the documented assessment and plan.

## 2013-11-04 MED ORDER — FLUTICASONE-SALMETEROL 250-50 MCG/DOSE IN AEPB
250-50 MCG/DOSE | Freq: Two times a day (BID) | RESPIRATORY_TRACT | Status: DC
Start: 2013-11-04 — End: 2014-05-04

## 2013-11-04 MED ORDER — MONTELUKAST SODIUM 10 MG PO TABS
10 MG | ORAL_TABLET | Freq: Every evening | ORAL | Status: DC
Start: 2013-11-04 — End: 2014-05-04

## 2013-11-04 MED ORDER — PREDNISONE 20 MG PO TABS
20 MG | ORAL_TABLET | Freq: Every day | ORAL | Status: AC
Start: 2013-11-04 — End: 2013-11-14

## 2013-11-04 MED ORDER — IPRATROPIUM BROMIDE HFA 17 MCG/ACT IN AERS
17 MCG/ACT | Freq: Four times a day (QID) | RESPIRATORY_TRACT | Status: DC
Start: 2013-11-04 — End: 2014-05-04

## 2013-11-04 MED ADMIN — ipratropium (ATROVENT) 0.02 % nebulizer solution 0.5 mg: 0.5 mg | RESPIRATORY_TRACT | @ 21:00:00 | NDC 16252009833

## 2013-11-04 MED ADMIN — albuterol (PROVENTIL) nebulizer solution 2.5 mg: 2.5 mg | RESPIRATORY_TRACT | @ 21:00:00 | NDC 00487950101

## 2013-11-04 NOTE — Progress Notes (Signed)
S: 48 y.o. female 3 month hx of SOB   Hx of asthma, morbid obesity. Also reports a 4 day hx of progressive sob   O: VS: BP 130/84    Pulse 80    Temp(Src) 97.8 ??F (36.6 ??C) (Oral)    Resp 18    Ht 5\' 4"  (1.626 m)    Wt 290 lb (131.543 kg)    BMI 49.75 kg/m2      SpO2 97%    LMP 06/04/2009      General: NAD, appropriate affect and grooming   CV:  RRR, no gallops, rubs, or murmurs   Resp: Bilateral Wheezing    Abd:  Soft, nontender   Ext:  No edema  Impression: asthma exacerbation   Plan: resume meds   Po steroids   Recheck in 3 days   Attending Physician Statement  I have discussed the case, including pertinent history and exam findings with the resident.  I also have seen the patient and performed key portions of the examination.  I agree with the documented assessment and plan.

## 2013-11-05 MED FILL — IPRATROPIUM-ALBUTEROL 0.5-2.5 (3) MG/3ML IN SOLN: RESPIRATORY_TRACT | Qty: 3

## 2013-11-05 MED FILL — ALBUTEROL SULFATE (2.5 MG/3ML) 0.083% IN NEBU: RESPIRATORY_TRACT | Qty: 3

## 2013-11-05 NOTE — Telephone Encounter (Signed)
Pt called and said the Advair diskus is too expensive    Can something else be called in for a replacement

## 2013-11-05 NOTE — Telephone Encounter (Signed)
PCP substituting Advair with Dulera inhaler for now.  LSW informed PCP of free [facility]. PCP wrote prescription for trial supply of Dulera and printed off voucher for free trial supply for patient to pick up.  Patient will need to pick up prescription and voucher to take to pharmacy.   In the interim, patient to be instructed to call insurance company regarding less expensive inhaler that is covered under plan and reminded will have to meet deductible if has one on plan.  Patient to be referred to PAP for additional assistance. Have patient speak with LSW if LSW for further assistance.         LSW had called [facility], spoke with Inetta Fermoina.  Advair cost $245 and Atrovent $95.  Inetta Fermoina reported giving patient specific instruction on calling member services for insurance and asking them what was less expensive inhaler and then to call PCP to get alternative prescription.  Patient may also need to meet deductible first considering it is first of the year as to why co pay is so high.   Also told patient had to order Atrovent, it came in today but will put hold on orders due to issue with cost and if patient decides to get prescriptions, just need to ask they be filled.

## 2013-11-05 NOTE — Telephone Encounter (Signed)
PCP spoke with LSW regarding any prescription assistance for Advair or another less expensive inhaler.  LSW advised would depend upon type of insurance coverage and patient may be eligible for PAP .  LSW agreed to follow up with patient.  Left voice mail message for patient to call back regarding request for less expensive inhaler.

## 2013-11-06 NOTE — Progress Notes (Signed)
Subjective:      Patient ID: Andrea Pitts is a 48 y.o. female.    Asthma  She complains of cough and shortness of breath. Pertinent negatives include no chest pain, fever, rhinorrhea, sneezing or sore throat. Her past medical history is significant for asthma.   Cough  Associated symptoms include shortness of breath. Pertinent negatives include no chest pain, chills, fever, rhinorrhea or sore throat. Her past medical history is significant for asthma.       Pt complains SOB and cough x 3 months. Worsening over the past 4 days.  SOB persistent - with and without exertion. Morbidly obese with a hx of asthma, says she ran out of her medications 2 weeks ago. Subjective fevers at home. Denies n/v, sinusitis, chest pain. Multiple sick contacts at home. Has been seen several times over the past couple months for same CC - CXRs were negative. Started seeing improvement until she ran out of her medications.    Review of Systems   Constitutional: Negative for fever and chills.   HENT: Negative for rhinorrhea, sneezing and sore throat.    Respiratory: Positive for cough and shortness of breath.    Cardiovascular: Negative for chest pain and leg swelling.   Gastrointestinal: Negative for nausea, vomiting, diarrhea and constipation.          Objective:   Physical Exam   Constitutional: She is oriented to person, place, and time. She appears well-developed and well-nourished.   Morbidly obese   HENT:   Head: Normocephalic and atraumatic.   Eyes: Conjunctivae and EOM are normal.   Cardiovascular: Normal rate and regular rhythm.    No murmur heard.  Pulmonary/Chest: Effort normal. She has wheezes.   Diffuse wheeze   Neurological: She is alert and oriented to person, place, and time.   Psychiatric: She has a normal mood and affect. Her behavior is normal. Judgment and thought content normal.     Filed Vitals:    11/04/13 1509   BP: 130/84   Pulse: 80   Temp: 97.8 ??F (36.6 ??C)   Resp: 18   Pulse ox 97 in office      Assessment:       Asthma exacerbation  Cough       Plan:      Restart meds, instructed to be seen Friday to confirm improvement in condition   Likely secondary to asthma, possible URI worsening condition, reassess on f/u for possible abx    RTO in 3 days

## 2013-11-06 NOTE — Telephone Encounter (Signed)
Wanted to let you know that the patient canceled her appt for Friday.  She says she wasn't able to get the inhaler that you prescribed  Filled, but she is feeling better and she didn't think it was necessary to come in.  I advised to her that she should keep her appt.

## 2013-11-06 NOTE — Telephone Encounter (Signed)
Pharmacy called and is asking for a less expensive inhaler than atrovent.

## 2013-12-22 NOTE — Telephone Encounter (Signed)
Patient called in requesting refills for Diamox, Hydrochlorothiazide and Lisinopril.  Patient would like Diamox and Hydrochlorothiazide sent to [facility] and Lisinopril sent to [facility].

## 2013-12-23 MED ORDER — HYDROCHLOROTHIAZIDE 12.5 MG PO CAPS
12.5 MG | ORAL_CAPSULE | Freq: Every day | ORAL | Status: DC
Start: 2013-12-23 — End: 2014-03-26

## 2013-12-23 MED ORDER — LISINOPRIL 10 MG PO TABS
10 MG | ORAL_TABLET | Freq: Every day | ORAL | Status: DC
Start: 2013-12-23 — End: 2014-03-26

## 2013-12-23 MED ORDER — HYDROCHLOROTHIAZIDE 12.5 MG PO CAPS
12.5 MG | ORAL_CAPSULE | Freq: Every day | ORAL | Status: DC
Start: 2013-12-23 — End: 2013-12-23

## 2013-12-23 MED ORDER — ACETAZOLAMIDE 250 MG PO TABS
250 MG | ORAL_TABLET | Freq: Four times a day (QID) | ORAL | Status: DC
Start: 2013-12-23 — End: 2013-12-23

## 2013-12-23 MED ORDER — ACETAZOLAMIDE 250 MG PO TABS
250 MG | ORAL_TABLET | Freq: Four times a day (QID) | ORAL | Status: DC
Start: 2013-12-23 — End: 2014-03-26

## 2014-01-22 MED ORDER — RANITIDINE HCL 150 MG PO TABS
150 MG | ORAL_TABLET | Freq: Two times a day (BID) | ORAL | Status: DC
Start: 2014-01-22 — End: 2014-06-25

## 2014-02-04 ENCOUNTER — Telehealth

## 2014-02-04 NOTE — Telephone Encounter (Signed)
Patient called in requesting a referral to [facility] for a mammogram.

## 2014-03-26 MED ORDER — ACETAZOLAMIDE 250 MG PO TABS
250 MG | ORAL_TABLET | Freq: Four times a day (QID) | ORAL | Status: DC
Start: 2014-03-26 — End: 2014-06-25

## 2014-03-26 MED ORDER — LISINOPRIL 10 MG PO TABS
10 MG | ORAL_TABLET | Freq: Every day | ORAL | Status: DC
Start: 2014-03-26 — End: 2014-06-25

## 2014-03-26 MED ORDER — HYDROCHLOROTHIAZIDE 12.5 MG PO CAPS
12.5 MG | ORAL_CAPSULE | Freq: Every day | ORAL | Status: DC
Start: 2014-03-26 — End: 2014-06-25

## 2014-03-26 MED ORDER — GABAPENTIN 100 MG PO CAPS
100 MG | ORAL_CAPSULE | Freq: Two times a day (BID) | ORAL | Status: DC
Start: 2014-03-26 — End: 2014-04-07

## 2014-03-26 MED ORDER — MELOXICAM 15 MG PO TABS
15 MG | ORAL_TABLET | Freq: Every day | ORAL | Status: DC
Start: 2014-03-26 — End: 2014-06-25

## 2014-03-26 NOTE — Progress Notes (Signed)
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  W/u has revealed L5-S1 spinal stenosis.  Some radiation to toes b/l.    O: VS: BP 136/70    Pulse 86    Temp(Src) 98.9 ??F (37.2 ??C) (Oral)    Resp 16    Wt 301 lb (136.533 kg)    LMP 06/04/2009     AAO/NAD, appropriate affect  CV:  RRR, no murmur  Resp: CTAB  TTP over the LS spine no SI tenderness. 5/5 strength and normal sensation throughout.  Neg SLR    Impression/Plan:   1) Spinal Stenosis - refer to PM&R or surg for discussion of options.  Will consider trial of NSAID and/or Neurontin vs TCA    Attending Physician Statement  I have discussed the case, including pertinent history and exam findings with the resident.  I agree with the documented assessment and plan.

## 2014-04-02 MED ORDER — ALBUTEROL SULFATE HFA 108 (90 BASE) MCG/ACT IN AERS
108 (90 Base) MCG/ACT | Freq: Four times a day (QID) | RESPIRATORY_TRACT | Status: DC | PRN
Start: 2014-04-02 — End: 2016-10-30

## 2014-04-02 NOTE — Telephone Encounter (Signed)
Pt called with concerns on her asthma medications.  SHe has not had any in some time, and forgot to ask at her visit on 04/01/14. Is it possible to refill one of her inhalers preferably the [employer] (per patient-  It is more affordable)    [phone]

## 2014-04-02 NOTE — Telephone Encounter (Signed)
Re-ordered to pharmacy.

## 2014-04-06 ENCOUNTER — Telehealth

## 2014-04-06 NOTE — Telephone Encounter (Signed)
Patient called in stating that she would like prescription for  Neurontin changed from 100 mg to 200 mg due to it not working that well.

## 2014-04-07 MED ORDER — GABAPENTIN 100 MG PO CAPS
100 MG | ORAL_CAPSULE | Freq: Three times a day (TID) | ORAL | Status: DC
Start: 2014-04-07 — End: 2014-05-04
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
## 2014-04-07 NOTE — Telephone Encounter (Signed)
Refill sent. Pt can take 100mg  3 times a day rather than twice a day.

## 2014-05-04 MED ORDER — MONTELUKAST SODIUM 10 MG PO TABS
10 MG | ORAL_TABLET | Freq: Every evening | ORAL | Status: DC
Start: 2014-05-04 — End: 2014-06-25

## 2014-05-04 MED ORDER — IPRATROPIUM BROMIDE 0.02 % IN SOLN
0.02 % | Freq: Four times a day (QID) | RESPIRATORY_TRACT | Status: DC
Start: 2014-05-04 — End: 2014-05-07

## 2014-05-04 MED ORDER — LIDOCAINE HCL 2 % EX GEL
2 % | CUTANEOUS | Status: DC
Start: 2014-05-04 — End: 2014-06-25

## 2014-05-04 MED ORDER — MOMETASONE FURO-FORMOTEROL FUM 200-5 MCG/ACT IN AERO
200-5 MCG/ACT | Freq: Two times a day (BID) | RESPIRATORY_TRACT | Status: DC
Start: 2014-05-04 — End: 2014-05-07

## 2014-05-04 MED ORDER — PREDNISONE 20 MG PO TABS
20 MG | ORAL_TABLET | ORAL | Status: AC
Start: 2014-05-04 — End: 2014-05-14

## 2014-05-04 NOTE — Progress Notes (Signed)
48 yo female here with complaints of asthma worsening over last 2 weeks.  Using her usual inhalers.  Not working as well as usual.  No known trigger.  Wheezing.  Coughing.  No fevers or chills.  Obese.  Also complaining of chronic lumbar pain.  Minimal relief with meloxicam and neurontin.  Has appt with Spine Dr next month.  No leg weakness or incontinence.      ROS otherwise negative     BP 119/64 mmHg   Pulse 74   Temp(Src) 99.1 ??F (37.3 ??C) (Oral)   Resp 16   Ht  (1.626 m)   Wt 295 lb (133.811 kg)   BMI 50.61 kg/m2   SpO2 97%   LMP 06/04/2009    Exam:  Gen:  nad  Heent:  Unremarkable   Heart:  rrr  Lungs:  Bilateral diffuse wheezes on expiration   Abd:  soft; nt  Ext:  No edema   MS:  Lumbar area ttp; good ROM but causes pain; negative SLR    A/P:  Asthma exacerbation  -      Albuterol and ipratropium increase frequency  -      Singulair and Dulera  -      Prednisone 40 mg x 5 days (add abx if persists)  Chronic lumbar pain  -      Lidocaine gel  -      Keep appt with Spine Dr     Lorraine Lax as needed    Attending Physician Statement  I have discussed the case, including pertinent history and exam findings with the resident. I agree with the documented assessment and plan.

## 2014-05-04 NOTE — Progress Notes (Signed)
Subjective:      Patient ID: Andrea Pitts is a 48 y.o. female.    HPI     Cough and SOB x 1-2 weeks. Wheezing. Taking inhalers without relief - albuterol twice a day, ipratropium twice a day.   Denies fever/chills, nausea/vomiting. No recent sick contacts. No longer taking Singulair because she didn't know she was supposed to. Can't afford Advair.    Chronic back pain - lumbar region, pain in all planes of movement. Minimal relief with Mobic, would like additional therapies if possible. Neurontin does not help.     Review of Systems   Constitutional: Negative for fever and chills.   HENT: Negative for ear pain, sneezing, sore throat and trouble swallowing.    Respiratory: Positive for cough, chest tightness, shortness of breath and wheezing.    Cardiovascular: Negative for chest pain and leg swelling.   Gastrointestinal: Negative for nausea, vomiting, diarrhea and constipation.   Musculoskeletal: Positive for myalgias and back pain.   Neurological: Negative for weakness, light-headedness and headaches.       Objective:   Physical Exam   Constitutional: She is oriented to person, place, and time. She appears well-developed and well-nourished. No distress.   HENT:   Head: Normocephalic and atraumatic.   Right Ear: External ear normal.   Left Ear: External ear normal.   Eyes: Conjunctivae and EOM are normal. Right eye exhibits no discharge. Left eye exhibits no discharge. No scleral icterus.   Neck: Normal range of motion. No thyromegaly present.   Cardiovascular: Normal rate, regular rhythm and normal heart sounds.    Pulmonary/Chest: Effort normal and breath sounds normal. No respiratory distress. She has no wheezes. She has no rales.   Abdominal: Soft. Bowel sounds are normal. She exhibits no distension and no mass. There is no tenderness. There is no rebound and no guarding.   Musculoskeletal: Normal range of motion. She exhibits tenderness. She exhibits no edema.   Lumbar back diffusely ttp, no pain over  spine, hypertonicity noted   Neurological: She is alert and oriented to person, place, and time.   Skin: Skin is warm and dry. No rash noted.   Psychiatric: She has a normal mood and affect. Her behavior is normal. Judgment and thought content normal.     Filed Vitals:    05/04/14 1056   BP: 119/64   Pulse: 74   Temp: 99.1 ??F (37.3 ??C)   Resp: 16         Assessment:      Asthma exacerbation  Chronic back pain       Plan:      - Increase frequency of inhalers, add Dulera and singulair, prednisone  x 5 days, look for triggers around home and remove if possible  - Cont mobic 15, add lidocaine gel, to see spine surgeon next month    RTO PRN

## 2014-05-04 NOTE — Patient Instructions (Signed)
Preventing Falls: After Your Visit  Your Care Instructions  Getting around your home safely can be a challenge if you have injuries or health problems that make it easy for you to fall. Loose rugs and furniture in walkways are among the dangers for many older people who have problems walking or who have poor eyesight. People who have conditions such as arthritis, osteoporosis, or dementia also have to be careful not to fall.  You can make your home safer with a few simple measures.  Follow-up care is a key part of your treatment and safety. Be sure to make and go to all appointments, and call your doctor if you are having problems. It's also a good idea to know your test results and keep a list of the medicines you take.  How can you care for yourself at home?  Taking care of yourself  ?? You may get dizzy if you do not drink enough water. To prevent dehydration, drink plenty of fluids, enough so that your urine is light yellow or clear like water. Choose water and other caffeine-free clear liquids. If you have kidney, heart, or liver disease and have to limit fluids, talk with your doctor before you increase the amount of fluids you drink.  ?? Exercise regularly to improve your strength, muscle tone, and balance. Walk if you can. Swimming may be a good choice if you cannot walk easily.  ?? Have your vision and hearing checked each year or any time you notice a change. If you have trouble seeing and hearing, you might not be able to avoid objects and could lose your balance.  ?? Know the side effects of the medicines you take. Ask your doctor or pharmacist whether the medicines you take can affect your balance. Sleeping pills or sedatives can affect your balance.  ?? Limit the amount of alcohol you drink. Alcohol can impair your balance and other senses.  ?? Ask your doctor whether calluses or corns on your feet need to be removed. If you wear loose-fitting shoes because of calluses or corns, you can lose your balance  and fall.  ?? Talk to your doctor if you have numbness in your feet.  Preventing falls at home  ?? Remove raised doorway thresholds, throw rugs, and clutter. Repair loose carpet or raised areas in the floor.  ?? Move furniture and electrical cords to keep them out of walking paths.  ?? Use nonskid floor wax, and wipe up spills right away, especially on ceramic tile floors.  ?? If you use a walker or cane, put rubber tips on it. If you use crutches, clean the bottoms of them regularly with an abrasive pad, such as steel wool.  ?? Keep your house well lit, especially stairways, porches, and outside walkways. Use night-lights in areas such as hallways and bathrooms. Add extra light switches or use remote switches (such as switches that go on or off when you clap your hands) to make it easier to turn lights on if you have to get up during the night.  ?? Install sturdy handrails on stairways.  ?? Move items in your cabinets so that the things you use a lot are on the lower shelves (about waist level).  ?? Keep a cordless phone and a flashlight with new batteries by your bed. If possible, put a phone in each of the main rooms of your house, or carry a cell phone in case you fall and cannot reach a phone. Or, you can wear   a device around your neck or wrist. You push a button that sends a signal for help.  ?? Wear low-heeled shoes that fit well and give your feet good support. Use footwear with nonskid soles. Check the heels and soles of your shoes for wear. Repair or replace worn heels or soles.  ?? Do not wear socks without shoes on wood floors.  ?? Walk on the grass when the sidewalks are slippery. If you live in an area that gets snow and ice in the winter, sprinkle salt on slippery steps and sidewalks.  Preventing falls in the bath  ?? Install grab bars and nonskid mats inside and outside your shower or tub and near the toilet and sinks.  ?? Use shower chairs and bath benches.  ?? Use a hand-held shower head that will allow you to  sit while showering.  ?? Get into a tub or shower by putting the weaker leg in first. Get out of a tub or shower with your strong side first.  ?? Repair loose toilet seats and consider installing a raised toilet seat to make getting on and off the toilet easier.  ?? Keep your bathroom door unlocked while you are in the shower.   Where can you learn more?   Go to [email or web address] and sign in to your MyChart account. Enter G117 in the Search Health Information box to learn more about ???Preventing Falls: After Your Visit.???    If you do not have an account, please click on the ???Sign Up Now??? link.     ?? 2006-2015 [employer]. Care instructions adapted under license by [facility]. This care instruction is for use with your licensed healthcare professional. If you have questions about a medical condition or this instruction, always ask your healthcare professional. Health[employer], Incorporated disclaims any warranty or liability for your use of this information.  Content Version: 10.5.422740; Current as of: July 18, 2013

## 2014-05-07 MED ORDER — IPRATROPIUM BROMIDE 0.02 % IN SOLN
0.02 % | Freq: Four times a day (QID) | RESPIRATORY_TRACT | Status: DC
Start: 2014-05-07 — End: 2014-05-08

## 2014-05-07 MED ORDER — MOMETASONE FURO-FORMOTEROL FUM 200-5 MCG/ACT IN AERO
200-5 MCG/ACT | Freq: Two times a day (BID) | RESPIRATORY_TRACT | Status: DC
Start: 2014-05-07 — End: 2016-10-30

## 2014-05-07 NOTE — Progress Notes (Signed)
Printed out Chief Executive Officer for [employer]. Pt needs to pick it up, left voicemail. Will keep in drawer.

## 2014-05-08 LAB — CBC WITH AUTO DIFFERENTIAL
Basophils %: 0 % (ref 0–2)
Basophils Absolute: 0.01 E9/L (ref 0.00–0.20)
Eosinophils %: 11 % — ABNORMAL HIGH (ref 0–6)
Eosinophils Absolute: 0.83 E9/L — ABNORMAL HIGH (ref 0.05–0.50)
Hematocrit: 40.7 % (ref 34.0–48.0)
Hemoglobin: 12.9 g/dL (ref 11.5–15.5)
Lymphocytes %: 20 % (ref 20–42)
Lymphocytes Absolute: 1.63 E9/L (ref 1.50–4.00)
MCH: 29 pg (ref 26.0–35.0)
MCHC: 31.8 % — ABNORMAL LOW (ref 32.0–34.5)
MCV: 91.1 fL (ref 80.0–99.9)
MPV: 6.8 fL — ABNORMAL LOW (ref 7.0–12.0)
Monocytes %: 8 % (ref 2–12)
Monocytes Absolute: 0.6 E9/L (ref 0.10–0.95)
Neutrophils %: 62 % (ref 43–80)
Neutrophils Absolute: 4.9 E9/L (ref 1.80–7.30)
Platelets: 360 E9/L (ref 130–450)
RBC: 4.47 E12/L (ref 3.50–5.50)
RDW: 13.2 fL (ref 11.5–15.0)
WBC: 8 E9/L (ref 4.5–11.5)

## 2014-05-08 LAB — BASIC METABOLIC PANEL
Anion Gap: 13 mmol/L (ref 7–16)
BUN: 16 mg/dL (ref 6–20)
CO2: 23 mmol/L (ref 22–29)
Calcium: 9.8 mg/dL (ref 8.6–10.2)
Chloride: 103 mmol/L (ref 98–107)
Creatinine: 1.1 mg/dL — ABNORMAL HIGH (ref 0.5–1.0)
GFR African American: >60
GFR Non-African American: >60 mL/min/{1.73_m2} (ref >=60)
Glucose: 94 mg/dL (ref 74–109)
Potassium: 3.6 mmol/L (ref 3.5–5.0)
Sodium: 139 mmol/L (ref 132–146)

## 2014-05-08 LAB — LIPID PANEL
Cholesterol, Total: 181 mg/dL (ref 0–199)
HDL: 39 mg/dL (ref >40)
LDL Calculated: 128 mg/dL — ABNORMAL HIGH (ref 0–99)
Triglycerides: 68 mg/dL (ref 0–149)
VLDL Cholesterol Calculated: 14 mg/dL

## 2014-05-08 MED ORDER — IPRATROPIUM BROMIDE 0.02 % IN SOLN
0.02 % | Freq: Four times a day (QID) | RESPIRATORY_TRACT | Status: AC | PRN
Start: 2014-05-08 — End: 2014-05-10

## 2014-05-08 NOTE — Telephone Encounter (Signed)
I clarified with new script. Thanks.

## 2014-05-08 NOTE — Telephone Encounter (Signed)
Pharmacy called asking what specific directions for Atrovent are. They are confused and want clarification. Please advise

## 2014-05-12 NOTE — Progress Notes (Signed)
Labs did not release original order

## 2014-05-12 NOTE — Telephone Encounter (Signed)
Filled out prior auth form and it's in your box to finish and sign please.

## 2014-05-12 NOTE — Telephone Encounter (Signed)
Pharmacy called in stating that [facility] needs a prior auth. Prior auth number  [id number].

## 2014-06-16 MED ORDER — DULOXETINE HCL 60 MG PO CPEP
60 MG | ORAL_CAPSULE | Freq: Every day | ORAL | Status: DC
Start: 2014-06-16 — End: 2014-08-04

## 2014-06-16 MED ORDER — DULOXETINE HCL 20 MG PO CPEP
20 MG | ORAL_CAPSULE | ORAL | Status: DC
Start: 2014-06-16 — End: 2014-08-04

## 2014-06-16 NOTE — Patient Instructions (Signed)
We appreciate that you chose [facility] to provide your healthcare needs today.  We took great pleasure in caring for you.  You may receive a survey to help us learn how to make your next visit to a [facility] facility better than the last.   Your feedback is important to help us continually improve the service we can provide you. Please take the time to complete it thoughtfully if you receive one as we value the feedback in every survey.   In this survey we would appreciate that you answer "always" or "yes" for as many questions as possible (this is the answer we get credit for doing a good job).  If you feel there is a question you cannot answer "always" or "yes", please let us know before you leave today so we can remedy the situation right away. We look forward to continuing to provide you with excellent care. Considering the survey questions related to access to care, please keep in mind the urgency of your problem (i.e. was it an emergent or urgent visit or more routine)  and whether the urgency of that problem was handled appropriately by our office.  Please understand that although we always strive to get you an appointment when you want or need it, we cannot always do that because this practice has only a single physician, and that this is a specialty practice in short supply with high demand for appointments.  Thank you for your time and consideration.

## 2014-06-16 NOTE — Progress Notes (Signed)
Andrea Pitts, D.O., P.T.  [facility] Physical Medicine [facility] Rehabilitation  [address], [address]  [place], [place] [address]  Phone: [phone]  Fax: [phone]    PCP: Loney Laurence, DO  Date of visit: 06/19/14    Chief Complaint   Patient presents with   ??? Back Pain     Consult for Spinal Stenosis       Dear Dr. Tery Sanfilippo,    As you know,  Andrea Pitts is a 48 y.o.  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 Now, the pain is constant and occurs daily.  The pain is rated Pain Score:   3, is described as , and is located in the low back with radiation to the bilateral calves. The symptoms have been worse since onset.  The pain is better with nothing.  The pain is worse with sitting, standing, walking, laying. There is associated giveway weakness in the legs,  numbness in the bilateral feet. The prior workup has included: Xray [facility], and CT showed spondylolisthesis L5-S1 with bilateral pars defect, degenerative bulging discs and spondylosis with severe lumbar stenosis.  The prior treatment has included: chiropractic.    Past Medical History   Diagnosis Date   ??? Hypertension    ??? 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 Pseudotumor cerebri    ??? Asthma        Past Surgical History   Procedure Laterality Date   ??? Cholecystectomy     ??? Hysterectomy     ??? Tonsillectomy         History     Social History   ??? Marital Status: Married     Social History Main Topics   ??? Smoking status: Never Smoker    ??? Smokeless tobacco: Not on file   ??? Alcohol Use: No   ??? Drug Use: Not on file   ??? Sexual Activity:     Partners: Male       History reviewed. No pertinent family history.    Allergies   Allergen Reactions   ??? Penicillins Rash       Current Outpatient Prescriptions   Medication Sig Dispense Refill   ??? DULoxetine (CYMBALTA) 20 MG capsule Take 1tab daily for 3d then 2 tab daily for 3 days then 3 tab daily X3d then call for new dose. 18 capsule 0   ???  DULoxetine (CYMBALTA) 60 MG capsule Take 1 capsule by mouth daily Start after titration with 20 mg complete 30 capsule 0   ??? mometasone-formoterol (DULERA) 200-5 MCG/ACT inhaler Inhale 2 puffs into the lungs 2 times daily Rinse mouth after using. 1 Inhaler 2   ??? montelukast (SINGULAIR) 10 MG tablet Take 1 tablet by mouth nightly 30 tablet 3   ??? lidocaine (XYLOCAINE) 2 % jelly Apply topically as needed. 1 Tube 1   ??? meloxicam (MOBIC) 15 MG tablet Take 1 tablet by mouth daily 30 tablet 1   ??? lisinopril (PRINIVIL;ZESTRIL) 10 MG tablet Take 1 tablet by mouth daily 90 tablet 1   ??? acetaZOLAMIDE (DIAMOX) 250 MG tablet Take 1 tablet by mouth 4 times daily 360 tablet 1   ??? hydrochlorothiazide (MICROZIDE) 12.5 MG capsule Take 1 capsule by mouth daily 90 capsule 1   ??? ranitidine (ZANTAC) 150 MG tablet Take 1 tablet by mouth 2 times daily 180 tablet 3   ??? Humidifiers (COOL MIST HUMIDIFIER) MISC 1 each  by Does not apply route daily as needed. Use at bedtime for any sore throat, congestion, runny nose and cough relief from colds, allergies or whatever is needed for symptom relief 1 each 0   ??? clobetasol (TEMOVATE) 0.05 % ointment Apply topically 2 times daily. 1 Tube 1   ??? triamcinolone (KENALOG) 0.025 % cream Apply topically 2 times daily. 1 Tube 1   ??? Carboxymethylcellul-Glycerin (OPTIVE SENSITIVE OP) Apply 2 drops to eye daily.     ??? ferrous sulfate 325 (65 FE) MG tablet Take 1 tablet by mouth daily (with breakfast). 30 tablet 3   ??? therapeutic multivitamin-minerals (THERAGRAN-M) tablet Take 1 tablet by mouth daily.     ??? albuterol (PROAIR HFA) 108 (90 BASE) MCG/ACT inhaler Inhale 2 puffs into the lungs every 6 hours as needed for Wheezing 1 Inhaler 3     No current facility-administered medications for this visit.         Review of Systems - For review of systems, positive symptoms are underlined and negative findings are not underlined.  General: chills, fatigue, fever, malaise, night sweats, weight gain,  weight loss.  Psychological: anxiety, depression, suicidal ideation, sleep disturbances, behavioral disorder, difficulty concentrating, disorientation, hallucinations, mood swings, obsessive thoughts, physical abuse,  sexual abuse. Ophthalmic: blurry vision, decreased vision, double vision, loss of vision, photophobia, use of corrective device. Ear Nose Throat: hearing loss, tinnitus, phonophobia, sensitivity to smells, vertigo, or vocal changes.Allergy/Immunology: seasonal allergies, watery eyes, itchy eyes, frequent infections.  Hematological and Lymphatic: bleeding problems, blood clots, bruising,  yellowing of the skin, swollen lymph nodes. Endocrine:  polydypsia, polyuria, temperature intolerance. Respiratory: cough, shortness of breath, wheezing. Cardiovascular: syncope, chest pain, dyspnea on exertion, edema, irregular heartbeat,  palpitations. Gastrointestinal: abdominal pain, constipation, diarrhea,  decreased appetite, heartburn, hematemesis, melena, nausea, vomiting, stool incontinence, abnormal swallowing.Genito-Urinary: dysuria, hematuria, incontinence, frequency, urgency. Musculoskeletal: joint pain, stiffness, swelling, muscle pain, muscle  tenderness. Neurological: confusion, memory loss, dizziness, gait disturbance, headaches, impaired coordination, decreased balance, numbness/tingling, seizures, speech problems, tremors,weakness.  Dermatological:  hair changes, nail changes, pruritus, rash.      Physical Exam: Blood pressure 138/78, pulse 70, height 5\' 5"  (1.651 m), weight 295 lb (133.811 kg), last menstrual period 06/04/2009. General: The patient is in no apparent distress. Body habitus is morbidly obese. HEENT: No rhinorrhea, sneezing, yawning, or lacrimation. No scleral icterus or conjunctival injection. SKIN: No piloerection. No track marks. No rash. Normal turgor. No erythema or ecchymosis. Psychological: Mood and affect are appropriate. Hygiene is appropriate. Cardiovascular:  Heart is regular rate and  rhythm.  Peripheral pulses are 2+ at the dorsalis pedis, posterior tibial and radial arteries.  There is no edema. Respiratory: Respirations are regular and unlabored.  There is no cyanosis.   Lymphatic: There is no cervical or inguinal lymphadenopathy.  Gastrointestinal: Soft abdomen, non-tender. No pulsating abdominal mass. Genitourinary: No costovertebral angle tenderness. MSK: Lumbar:  Cervical lordosis normal,  thoracic kyphosis normal and lumbar lordosis increased.No hairy patch, cafe au lait, nevi, hemangioma or dimpling over lumbar area.Pelvis level, no scoliosis, leg length equal. Seated and standing flexion tests negative. There is no step off deformity.No superficial or bony tenderness. Lumbar AROM in flexion is 60 degrees, in extension is 30 degrees, in left rotation is 30degrees, in right rotation is 30 degrees, in left lateral flexion is 30 degrees and in right lateral flexion is 30 degrees.  There is tenderness to palpation at bilateral lumbosacral paraspinals and SI joint.  No tenderness to palpation at  bilateral gluteal muscles,  bilateral pubic tubercle, bilateral PSIS, bilateral ischial tuberosity, bilateral greater trochanter, bilateral ASIS, bilateral iliac crest.  No trigger points. No spasm.  No edema, erythema, ecchymosis,  mass or deformity.Taut bands absent.   Popliteal angle is normal bilateral. Supine straight leg raise negative bilaterally.  Seated straight leg raise negative bilaterally.  SIJ: Gillet negative bilaterally.  FABER negative bilaterally. ASIS compression test negative bilaterally.. Hip: Full painless AROM bilaterally. Patrick negative bilaterally. Trendelenburg negative bilaterally.  Neurologic: Awake, alert and oriented in three planes.  Speech is fluent.  No facial weakness.  Tongue is midline.  Hearing is intact for conversation.  Pupils are equal and round.  Extraocular muscles are intact.  Hearing is intact for conversation. Shoulder shrug symmetric. Sensation: Intact  for light touch and pin prick in all upper and lower extremity dermatomes. Vibration and proprioception are intact at the bilateral first MTP.  Strength: 5/5 in all myotomes in the upper and lower extremities.  Muscle Tendon Reflexes: 2+ symmetric in the bilateral upper and lower extremities. Babinski is downgoing bilaterally. Mikey BussingHoffman is negative bilaterally. Gait is Normal.   Romberg is negative.  Heel and toe walk are normal.  Tandem walk is normal.  No clonus or spasticity.  The patient was able to rise from a chair and squat without difficulty. There is no tremor.     Impression:   1. Spondylolisthesis at L5-S1 level    2. Lumbar stenosis    3. Bilateral low back pain with sciatica, sciatica laterality unspecified    4. Spondylolysis, lumbosacral        Plan:   Orders Placed This Encounter   Procedures   ??? XR Lumbar Spine Flexion and Extension Only 4 or more VW     Standing Status: Future      Number of Occurrences:       Standing Expiration Date: 06/19/2015     Order Specific Question:  Reason for exam:     Answer:  eval stability of spondylolisthesis L5-S1   ??? Ambulatory referral to Physical Therapy     Referral Priority:  Routine     Referral Type:  Eval and Treat     Requested Specialty:  Physical Therapy     Number of Visits Requested:  1     Orders Placed This Encounter   Medications   ??? DULoxetine (CYMBALTA) 20 MG capsule     Sig: Take 1tab daily for 3d then 2 tab daily for 3 days then 3 tab daily X3d then call for new dose.     Dispense:  18 capsule     Refill:  0   ??? DULoxetine (CYMBALTA) 60 MG capsule     Sig: Take 1 capsule by mouth daily Start after titration with 20 mg complete     Dispense:  30 capsule     Refill:  0     The patient was educated about the diagnosis, prognosis, indications, risks and benefits of treatment. An opportunity to ask questions was given to the patient and questions were answered.  The patient agreed to proceed with the recommended treatment as described above.   Follow up  6 weeks     Thank you for the consultation and for allowing me to participate in the care of this patient.      Sincerely,         Andrea BarbaraStephanie Quayshaun Hubbert, D.O., P.T.  Board Certified Physical Medicine and Rehabilitation  Board Certified Electrodiagnostic Medicine
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
## 2014-06-25 ENCOUNTER — Ambulatory Visit: Admit: 2014-06-25 | Discharge: 2014-06-25 | Payer: MEDICARE | Attending: Family Medicine | Primary: Family Medicine

## 2014-06-25 DIAGNOSIS — Z23 Encounter for immunization: Secondary | ICD-10-CM

## 2014-06-25 MED ORDER — HYDROCHLOROTHIAZIDE 12.5 MG PO CAPS
12.5 MG | ORAL_CAPSULE | Freq: Every day | ORAL | Status: AC
Start: 2014-06-25 — End: 2015-06-25

## 2014-06-25 MED ORDER — RANITIDINE HCL 150 MG PO TABS
150 MG | ORAL_TABLET | Freq: Two times a day (BID) | ORAL | Status: AC
Start: 2014-06-25 — End: ?

## 2014-06-25 MED ORDER — MELOXICAM 15 MG PO TABS
15 MG | ORAL_TABLET | Freq: Every day | ORAL | Status: DC
Start: 2014-06-25 — End: 2016-10-30

## 2014-06-25 MED ORDER — LISINOPRIL 10 MG PO TABS
10 MG | ORAL_TABLET | Freq: Every day | ORAL | Status: DC
Start: 2014-06-25 — End: 2016-10-30

## 2014-06-25 MED ORDER — ACETAZOLAMIDE 250 MG PO TABS
250 MG | ORAL_TABLET | Freq: Four times a day (QID) | ORAL | Status: AC
Start: 2014-06-25 — End: 2014-10-23

## 2014-06-25 MED ORDER — MONTELUKAST SODIUM 10 MG PO TABS
10 MG | ORAL_TABLET | Freq: Every evening | ORAL | Status: DC
Start: 2014-06-25 — End: 2016-08-03

## 2014-06-25 NOTE — Progress Notes (Signed)
[facility] RAD     Back on all her meds now, so is under control    No concerns or issues    Blood pressure 122/70, pulse 76, temperature 98.7 ??F (37.1 ??C), temperature source Oral, resp. rate 16, height 5\' 4"  (1.626 m), weight 298 lb (135.172 kg), last menstrual period 06/04/2009, SpO2 96 %.    HEENT WNL     Heart regular    Lungs clear    abd non-tender      No edema    Pulses intact     To continue rxs as available, and confer with LSW    RTO 3 mos    Attending Physician Statement  I have discussed the case, including pertinent history and exam findings with the resident. I agree with the documented assessment and plan.

## 2014-06-29 NOTE — Telephone Encounter (Signed)
Patient [facility] given at appointment.

## 2014-06-30 ENCOUNTER — Inpatient Hospital Stay: Primary: Family Medicine

## 2014-06-30 MED ORDER — ADVAIR DISKUS 250-50 MCG/DOSE IN AEPB
250-50 MCG/DOSE | Freq: Two times a day (BID) | RESPIRATORY_TRACT | Status: DC
Start: 2014-06-30 — End: 2014-07-03

## 2014-06-30 NOTE — Other (Signed)
[name]. [name]  Phone: [phone] Fax: [phone]     Physical Therapy  Cancellation/No-show Note  Patient Name:  [name]  DOB:  Oct 22, 1965   Date:  06/30/2014    For today's appointment patient:  [x]   Cancelled  []   Rescheduled appointment  []   No-show     Reason given by patient:  []   Patient ill  []   Conflicting appointment  []   No transportation    []   Conflict with work  []   No reason given  [x]   Other:  Cancel Tuesday Thursday -husband just released from hospital   Comments:  RS for next week - 11/03-11/05    Electronically signed by:  Arlyn Leakawn Tomy Khim PTA [phone]

## 2014-06-30 NOTE — Telephone Encounter (Signed)
Done, thanks!

## 2014-07-01 NOTE — Progress Notes (Signed)
Subjective:      Patient ID: Andrea DriversLaticia Lewallen is a 48 y.o. female.    HPI    1 month f/u for cough, wheeze, SOB.     Has been taking meds and symptoms have resolved. No longer coughing or wheezing. [facility]. Denies n/v, f/c. No other concerns.    Review of Systems   Constitutional: Negative for fever and chills.   HENT: Negative for sneezing, sore throat and trouble swallowing.    Respiratory: Negative for cough, chest tightness, shortness of breath and wheezing.    Cardiovascular: Negative for palpitations and leg swelling.       Objective:   Physical Exam   Constitutional: She is oriented to person, place, and time. She appears well-developed and well-nourished. No distress.   HENT:   Head: Normocephalic and atraumatic.   Right Ear: External ear normal.   Left Ear: External ear normal.   Eyes: Conjunctivae and EOM are normal. Right eye exhibits no discharge. Left eye exhibits no discharge. No scleral icterus.   Neck: Normal range of motion. No thyromegaly present.   Cardiovascular: Normal rate, regular rhythm and normal heart sounds.    Pulmonary/Chest: Effort normal and breath sounds normal. No respiratory distress. She has no wheezes. She has no rales.   Abdominal: Soft. Bowel sounds are normal. She exhibits no distension and no mass. There is no tenderness. There is no rebound and no guarding.   Neurological: She is alert and oriented to person, place, and time.   Psychiatric: She has a normal mood and affect. Her behavior is normal. Judgment and thought content normal.     Filed Vitals:    06/25/14 1040   BP: 122/70   Pulse: 76   Temp: 98.7 ??F (37.1 ??C)   Resp: 16   SpO2: 96%         Assessment:      Asthma  Obesity      Plan:      - Controlled when compliant with meds, Advair voucher printed out, albuterol PRN  - Encouraged weight loss    RTO 3 months, PRN

## 2014-07-02 ENCOUNTER — Encounter: Primary: Family Medicine

## 2014-07-02 ENCOUNTER — Telehealth

## 2014-07-02 NOTE — Telephone Encounter (Signed)
Pt would like to pick up a paper script of the Advair

## 2014-07-02 NOTE — Telephone Encounter (Signed)
Pt called and re status of Advair  Can it be sent to [facility] [address] please  She states it is not there as of yet  Thanks

## 2014-07-03 MED ORDER — ADVAIR DISKUS 250-50 MCG/DOSE IN AEPB
250-50 MCG/DOSE | Freq: Two times a day (BID) | RESPIRATORY_TRACT | Status: DC
Start: 2014-07-03 — End: 2014-07-09

## 2014-07-03 NOTE — Telephone Encounter (Signed)
That wasn't the pharmacy she told us. [facility] [address]. If she needs to pick it up ok to [facility] cause I'm not there.

## 2014-07-07 ENCOUNTER — Inpatient Hospital Stay: Admit: 2014-07-07 | Primary: Family Medicine
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
## 2014-07-09 ENCOUNTER — Inpatient Hospital Stay: Admit: 2014-07-09 | Primary: Family Medicine

## 2014-07-09 MED ORDER — ADVAIR DISKUS 250-50 MCG/DOSE IN AEPB
250-50 MCG/DOSE | Freq: Two times a day (BID) | RESPIRATORY_TRACT | Status: DC
Start: 2014-07-09 — End: 2016-10-30

## 2014-07-09 NOTE — Other (Signed)
[name]. [name]  Phone: [phone] Fax: [phone]        Physical Therapy Aquatic Flow Sheet   Date:  07/09/2014    Patient Name:  Andrea PiqueLaticia Pitts    DOB:  1966-06-10  Restrictions/Precautions:  Visual Impairment - Pt can see shadows and reports having cloudy vision.     Diagnosis:  Lumbar Stenosis   Treatment Diagnosis:    Insurance/Certification information:  [facility]   Referring Physician:  Dr. Altamese CabalKopey   Plan of care signed (Y/N):    Visit# / total visits:  3/12  2 x week for 6-8 weeks   Pain level: 4/10   Electronically signed by:  Arlyn Leakawn Shelanda Duvall, PTA [id number]  Time In:1330  Time Out:1400    Subjective:  Pt denies any complication from her first session of aquatic therapy.  She states "I actually felt really good afterwards."  Husband present and observes aquatic session.    Key-HH=hand held assist  B= Belt DB= Dumbells T= Theratube   H= Hydrotone N= Noodles W= Weights   P= Paddles S= Speedo equipment K= Kickboard     Exercises/Activities   Warm-up/Amb  1 hand bar  Exercises      Slow forward   1/2 bar 5x HH PTA  HR/TR  20x    Slow sideways  1/2 bar 6 laps  Marches  20x    Slow backwards  1/2 bar 5x HH PTA  Mini-squats  20       4-way SLR  10x each   Walking laps around entire pool w/ PTA  4x-did well  Hip circles/fig 8         Hamstring curls      Jog    Knee extension      Braiding    Pelvic tilts      Bicycling  step x 5 minutes  Scap squeezes          Shoulder flex/ext      Functional    Shoulder abd/add      Step    Shoulder H. abd/add      Lifting    Shoulder IR/ER      Hand to opp knee    Rowing      Push down squat    Bilateral pull down      UE PNF    Push/pull      LE PNF    Push downs      Wall push ups    Arm circles      SLS    Elbow flex/ext          Chin tuck      Stretching    UT shrugs/rolls      Gastroc/Soleus    Rocking horse      Hamstring          SKTC    Other      Piriformis          Hip flexor          Ladder pull          Pec stretch          Post  deltoid           Timed Code Treatment Minutes:  30  Total Treatment Minutes:  30    Treatment/Activity Tolerance:  [x]  Patient tolerated treatment well []  Patient limited by fatique  []  Patient limited by pain []  Patient limited by other medical complications  [x]   Other: great effort despite visual impairments-pt pleasant and cooperative-able to complete all tasks without complication    Prognosis: []  Good [x]  Fair  []  Poor    Patient Requires Follow-up: [x]  Yes  []  No    Plan:   [x]  Continue per plan of care []  Alter current plan (see comments)  []  Plan of care initiated []  Hold pending MD visit []  Discharge        Electronically signed by:  Arlyn Leakawn Maddelyn Rocca PTA [phone]

## 2014-07-09 NOTE — Telephone Encounter (Signed)
Patient called and stated she needs a written script for the advair and she will come in to pick up.

## 2014-07-09 NOTE — Telephone Encounter (Signed)
Printed and waiting.

## 2014-07-10 NOTE — Telephone Encounter (Signed)
Left message ready to be picked up.

## 2014-07-14 ENCOUNTER — Inpatient Hospital Stay: Primary: Family Medicine

## 2014-07-14 NOTE — Other (Signed)
[name]. [name]  Phone: [phone] Fax: [phone]     Physical Therapy  Cancellation/No-show Note  Patient Name:  [name]  DOB:  [birth year]   Date:  07/14/2014    For today's appointment patient:  [x]   Cancelled  []   Rescheduled appointment  []   No-show     Reason given by patient:  []   Patient ill  []   Conflicting appointment  []   No transportation    []   Conflict with work  []   No reason given  [x]   Other:  Husband to ER  Electronically signed by:  Arlyn Leakawn Trimaine Maser PTA [phone]

## 2014-07-16 ENCOUNTER — Encounter: Primary: Family Medicine

## 2014-07-21 ENCOUNTER — Inpatient Hospital Stay: Admit: 2014-07-21 | Primary: Family Medicine

## 2014-07-21 NOTE — Other (Signed)
[name]. [name] [facility]  Phone: [phone] Fax: [phone]        Physical Therapy Aquatic Flow Sheet   Date:  07/21/2014    Patient Name:  Andrea Pitts    DOB:  [birth year]  Restrictions/Precautions:  Visual Impairment - Pt can see shadows and reports having cloudy vision.     Diagnosis:  Lumbar Stenosis   Treatment Diagnosis:    Insurance/Certification information:  [facility]   Referring Physician:  Dr. Altamese CabalKopey   Plan of care signed (Y/N):    Visit# / total visits:  4/12  2 x week for 6-8 weeks   Pain level: 4/10   Electronically signed by:  Arlyn Leakawn Bee Hammerschmidt, PTA [id number]  Time In:1330  Time Out:1430    Subjective:  Pt denies any complication from her last session of aquatic therapy.  She states "I actually felt really good afterwards."  Husband present and observes aquatic session.    Key-HH=hand held assist  B= Belt DB= Dumbells T= Theratube   H= Hydrotone N= Noodles W= Weights   P= Paddles S= Speedo equipment K= Kickboard     Exercises/Activities   Warm-up/Amb  1 hand bar  Exercises      Slow forward   1/2 bar 5x HH PTA  HR/TR  20x    Slow sideways  1/2 bar 6 laps  Marches  20x    Slow backwards  1/2 bar 5x HH PTA  Mini-squats  20       4-way SLR  10x each   Walking laps around entire pool w/ PTA  4x-did well  Hip circles/fig 8         Hamstring curls      Jog    Knee extension      Braiding    Pelvic tilts      Bicycling  step x 5 minutes  Scap squeezes          Shoulder flex/ext      Functional    Shoulder abd/add      Step    Shoulder H. abd/add      Lifting    Shoulder IR/ER      Hand to opp knee    Rowing      Push down squat    Bilateral pull down      UE PNF    Push/pull      LE PNF    Push downs      Wall push ups    Arm circles      SLS    Elbow flex/ext          Chin tuck      Stretching    UT shrugs/rolls      Gastroc/Soleus    Rocking horse      Hamstring          SKTC    Other      Piriformis          Hip flexor          Ladder pull          Pec stretch          Post  deltoid           Timed Code Treatment Minutes:  50  Total Treatment Minutes:  60    Treatment/Activity Tolerance:  [x]  Patient tolerated treatment well []  Patient limited by fatique  []  Patient limited by pain []  Patient limited by other medical complications  [x]   Other: able to complete all tasks without complication     Prognosis: []  Good [x]  Fair  []  Poor    Patient Requires Follow-up: [x]  Yes  []  No    Plan:   [x]  Continue per plan of care []  Alter current plan (see comments)  []  Plan of care initiated []  Hold pending MD visit []  Discharge        Electronically signed by:  Arlyn Leakawn Dusty Wagoner PTA [phone]

## 2014-07-23 ENCOUNTER — Inpatient Hospital Stay: Admit: 2014-07-23 | Primary: Family Medicine

## 2014-07-23 NOTE — Other (Signed)
[name]. [name]  Phone: [phone] Fax: [phone]        Physical Therapy Aquatic Flow Sheet   Date:  07/23/2014    Patient Name:  Andrea Pitts    DOB:  10/09/1965  Restrictions/Precautions:  Visual Impairment - Pt can see shadows and reports having cloudy vision.     Diagnosis:  Lumbar Stenosis   Treatment Diagnosis:    Insurance/Certification information:  [facility]   Referring Physician:  Dr. Altamese CabalKopey   Plan of care signed (Y/N):    Visit# / total visits:  5/12  2 x week for 6-8 weeks   Pain level: 6/10 -R knee  Electronically signed by:  Arlyn Leakawn Gagandeep Kossman, PTA [id number]  Time In:1330  Time Out:1430    Subjective:  Pt c/o "R knee pain this afternoon."  Husband present and observes aquatic session.    Key-HH=hand held assist  B= Belt DB= Dumbells T= Theratube   H= Hydrotone N= Noodles W= Weights   P= Paddles S= Speedo equipment K= Kickboard     Exercises/Activities   Warm-up/Amb  1 hand bar  Exercises      Slow forward   full bar 5x HH PTA  HR/TR  20x2    Slow sideways  full bar 6 laps  Marches  20x2    Slow backwards  full bar 5x HH PTA  Mini-squats  202       4-way SLR  10x each   Walking laps around entire pool w/ PTA  4x-did well  Hip circles/fig 8         Hamstring curls      Jog    Knee extension      Braiding    Pelvic tilts      Bicycling  step x 5 minutes  Scap squeezes          Shoulder flex/ext      Functional    Shoulder abd/add      Step    Shoulder H. abd/add      Lifting    Shoulder IR/ER      Hand to opp knee    Rowing      Push down squat    Bilateral pull down      UE PNF    Push/pull      LE PNF    Push downs      Wall push ups    Arm circles      SLS    Elbow flex/ext          Chin tuck      Stretching    UT shrugs/rolls      Gastroc/Soleus    Rocking horse      Hamstring          SKTC    Other      Piriformis          Hip flexor          Ladder pull          Pec stretch          Post deltoid           Timed Code Treatment Minutes:  50  Total Treatment  Minutes:  60    Treatment/Activity Tolerance:  [x]  Patient tolerated treatment well []  Patient limited by fatique  []  Patient limited by pain []  Patient limited by other medical complications  []  Other:   Prognosis: []  Good [x]  Fair  []  Poor  Patient Requires Follow-up: [x]  Yes  []  No    Plan:   [x]  Continue per plan of care []  Alter current plan (see comments)  []  Plan of care initiated []  Hold pending MD visit []  Discharge        Electronically signed by:  Beverly Sessions PTA [phone]

## 2014-07-28 ENCOUNTER — Inpatient Hospital Stay: Admit: 2014-07-28 | Primary: Family Medicine

## 2014-07-28 NOTE — Other (Signed)
[name]. [name]  Phone: [phone] Fax: [phone]        Physical Therapy Aquatic Flow Sheet   Date:  07/28/2014    Patient Name:  Andrea PiqueLaticia Pitts    DOB:  1966/05/09  Restrictions/Precautions:  Visual Impairment - Pt can see shadows and reports having cloudy vision.     Diagnosis:  Lumbar Stenosis   Treatment Diagnosis:    Insurance/Certification information:  [facility]   Referring Physician:  Dr. Altamese CabalKopey   Plan of care signed (Y/N):    Visit# / total visits:  6/12  2 x week for 6-8 weeks   Pain level: 6/10 -R knee  Electronically signed by:  Arlyn Leakawn Analy Bassford, PTA [id number]  Time In:1330  Time Out:1430    Subjective:  Pt c/o "low back hurts this afternoon."  Mother present and performs aquatic session.    Key-HH=hand held assist  B= Belt DB= Dumbells T= Theratube   H= Hydrotone N= Noodles W= Weights   P= Paddles S= Speedo equipment K= Kickboard     Exercises/Activities   Warm-up/Amb  1 hand bar  Exercises      Slow forward   full bar 5x HH PTA  HR/TR  20x2    Slow sideways  full bar 6 laps  Marches  20x2    Slow backwards  full bar 5x HH PTA  Mini-squats  202       4-way SLR  10x each   Walking laps around entire pool w/ PTA  4x-did well  Hip circles/fig 8         Hamstring curls      Jog    Knee extension      Braiding    Pelvic tilts      Bicycling  step x 5 minutes  Scap squeezes          Shoulder flex/ext      Functional    Shoulder abd/add      Step    Shoulder H. abd/add      Lifting    Shoulder IR/ER      Hand to opp knee    Rowing      Push down squat    Bilateral pull down      UE PNF    Push/pull      LE PNF    Push downs      Wall push ups    Arm circles      SLS    Elbow flex/ext          Chin tuck      Stretching    UT shrugs/rolls      Gastroc/Soleus    Rocking horse      Hamstring          SKTC    Other      Piriformis          Hip flexor          Ladder pull          Pec stretch          Post deltoid           Timed Code Treatment Minutes:  50  Total Treatment  Minutes:  60    Treatment/Activity Tolerance:  [x]  Patient tolerated treatment well []  Patient limited by fatique  []  Patient limited by pain []  Patient limited by other medical complications  []  Other:   Prognosis: []  Good [x]  Fair  []  Poor  Patient Requires Follow-up: [x]  Yes  []  No    Plan:   [x]  Continue per plan of care []  Alter current plan (see comments)  []  Plan of care initiated []  Hold pending MD visit []  Discharge        Electronically signed by:  Beverly Sessions PTA [phone]

## 2014-08-04 ENCOUNTER — Encounter: Primary: Family Medicine

## 2014-08-04 ENCOUNTER — Ambulatory Visit
Admit: 2014-08-04 | Discharge: 2014-08-04 | Payer: MEDICARE | Attending: Physical Medicine & Rehabilitation | Primary: Family Medicine

## 2014-08-04 DIAGNOSIS — M48061 Spinal stenosis, lumbar region without neurogenic claudication: Secondary | ICD-10-CM

## 2014-08-04 MED ORDER — TRAMADOL HCL 50 MG PO TABS
50 MG | ORAL_TABLET | Freq: Four times a day (QID) | ORAL | Status: AC | PRN
Start: 2014-08-04 — End: 2014-08-14

## 2014-08-04 NOTE — Other (Signed)
[name]. [name]  Phone: [phone] Fax: [phone]     Physical Therapy  Cancellation/No-show Note  Patient Name:  Andrea Pitts  DOB:  1966-05-04   Date:  08/04/2014    For today's appointment patient:  [x]   Cancelled  []   Rescheduled appointment  []   No-show     Reason given by patient:  []   Patient ill  []   Conflicting appointment  []   No transportation    []   Conflict with work  []   No reason given  [x]   Other: Cancelled for the week of 11/30-12/04 will return on 08/10/2014  Electronically signed by:  Arlyn Leakawn Wadell Craddock PTA [phone]

## 2014-08-04 NOTE — Progress Notes (Signed)
Andrea Pitts, D.O.  [facility]  [address], [address]  [place], [place] [id number]  Phone: [phone]  Fax: [phone]      08/04/14    Chief Complaint   Patient presents with   ??? Back Pain       HPI:  Andrea Pitts is a 48 y.o. year old woman seen today in follow up regarding low back pain.     Interval history: Since the last visit the patient  Tried to fill cymbalta but it was too expensive. She has been attending water therapy.  Today, the pain is rated Pain Score:   2 where 0 is no pain and 10 is pain as bad as it can be. The pain is located in the low back,  does not radiate, and is described as throbbing.  This pain occurs all day. The symptoms have been better since onset.  Symptoms are exacerbated by . Factors which relieve the pain include prolonged in any position.  Other associated symptoms include none.  Otherwise, the pain assessment has not changed since the last visit.     Past Medical History   Diagnosis Date   ??? Hypertension    ??? 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 Pseudotumor cerebri    ??? Asthma        Past Surgical History   Procedure Laterality Date   ??? Cholecystectomy     ??? Hysterectomy     ??? Tonsillectomy         History     Social History   ??? Marital Status: Married     Spouse Name: N/A     Number of Children: N/A   ??? Years of Education: N/A     Social History Main Topics   ??? Smoking status: Never Smoker    ??? Smokeless tobacco: None   ??? Alcohol Use: No   ??? Drug Use: None   ??? Sexual Activity:     Partners: Male     Other Topics Concern   ??? None     Social History Narrative       History reviewed. No pertinent family history.    Current Outpatient Prescriptions   Medication Sig Dispense Refill   ??? Omega-3 Fatty Acids (FISH OIL) 1000 MG CAPS Take 3,000 mg by mouth 3 times daily     ??? traMADol (ULTRAM) 50 MG tablet Take 1 tablet by mouth every 6 hours as needed for Pain 60 tablet 0   ??? ADVAIR DISKUS 250-50 MCG/DOSE AEPB Inhale 1 puff into  the lungs every 12 hours 60 each 3   ??? montelukast (SINGULAIR) 10 MG tablet Take 1 tablet by mouth nightly 90 tablet 3   ??? meloxicam (MOBIC) 15 MG tablet Take 1 tablet by mouth daily 90 tablet 1   ??? lisinopril (PRINIVIL;ZESTRIL) 10 MG tablet Take 1 tablet by mouth daily 90 tablet 1   ??? acetaZOLAMIDE (DIAMOX) 250 MG tablet Take 1 tablet by mouth 4 times daily 360 tablet 1   ??? hydrochlorothiazide (MICROZIDE) 12.5 MG capsule Take 1 capsule by mouth daily 90 capsule 1   ??? ranitidine (ZANTAC) 150 MG tablet Take 1 tablet by mouth 2 times daily 180 tablet 3   ??? mometasone-formoterol (DULERA) 200-5 MCG/ACT inhaler Inhale 2 puffs into the lungs 2 times daily Rinse mouth after using. 1 Inhaler 2   ??? albuterol (PROAIR HFA) 108 (90 BASE) MCG/ACT inhaler Inhale 2 puffs into the  lungs every 6 hours as needed for Wheezing 1 Inhaler 3   ??? Humidifiers (COOL MIST HUMIDIFIER) MISC 1 each by Does not apply route daily as needed. Use at bedtime for any sore throat, congestion, runny nose and cough relief from colds, allergies or whatever is needed for symptom relief 1 each 0   ??? clobetasol (TEMOVATE) 0.05 % ointment Apply topically 2 times daily. 1 Tube 1   ??? triamcinolone (KENALOG) 0.025 % cream Apply topically 2 times daily. 1 Tube 1   ??? Carboxymethylcellul-Glycerin (OPTIVE SENSITIVE OP) Apply 2 drops to eye daily.     ??? ferrous sulfate 325 (65 FE) MG tablet Take 1 tablet by mouth daily (with breakfast). 30 tablet 3   ??? therapeutic multivitamin-minerals (THERAGRAN-M) tablet Take 1 tablet by mouth daily.       No current facility-administered medications for this visit.       Allergies   Allergen Reactions   ??? Penicillins Rash       Review of Systems:  No new weakness, paresthesia, incontinence of bowel or bladder, saddle anesthesia, falls or gait dysfunction. Otherwise, per HPI.     Physical Exam:   Blood pressure 130/74, pulse 77, resp. rate 18, height 5\' 4"  (1.626 m), weight 290 lb (131.543 kg), last menstrual period  06/04/2009.  GENERAL: The patient is in no apparent distress. Body habitus is obese.  HEENT: No rhinorrhea, sneezing, yawning, or lacrimation. No scleral icterus or conjunctival injection.   SKIN: No piloerection. No tract marks. No rash.    PSYCH: Mood and affect are appropriate. Hygiene is appropriate.  CARDIOVASCULAR  Heart is regular rate and rhythm.  There is no edema.   RESPIRATORY: Respirations are regular and unlabored.  There is no cyanosis.   GASTROINTESTINAL: Soft abdomen, non-tender.  MSK: There is no joint effusion, deformity, instability, swelling, erythema or warmth.  AROM is full in the lumbar spine.  Spinal curvatures are normal.    Tender bilateral lumbosacral junction. Negative SLR.   NEURO: Gait is normal. No focal sensorimotor deficit.  Reflexes 2+ and symmetric in lower extremities.     Impression:   1. Spinal stenosis of lumbar region    2. Lumbar radicular pain    3. Morbid obesity, unspecified obesity type (HCC)        Plan:  Continue PT  Orders Placed This Encounter   Medications   ??? traMADol (ULTRAM) 50 MG tablet     Sig: Take 1 tablet by mouth every 6 hours as needed for Pain     Dispense:  60 tablet     Refill:  0       Medications Discontinued During This Encounter   Medication Reason   ??? DULoxetine (CYMBALTA) 20 MG capsule Cost of medication   ??? DULoxetine (CYMBALTA) 60 MG capsule Cost of medication       The patient was educated about the diagnosis, prognosis, indications, risks and benefits of treatment. An opportunity to ask questions was given to the patient and questions were answered.  The patient agreed to proceed with the recommended treatment as described above.     Return in about 1 month (around 09/04/2014).       Thank you for allowing me to participate in the care of your patient.       Andrea BarbaraStephanie Eliese Kerwood, D.O., P.T.  Board Certified Physical Medicine and Rehabilitation  Board Certified Electrodiagnostic Medicine

## 2014-08-04 NOTE — Patient Instructions (Signed)
Dr. Stephanie Kopey, D.O.  [facility]  [address]  [place] [address]  Phone: [phone]  Fax: [phone]    Before your next visit, please feel free to call [phone] especially if    ?? You have any questions about the recommended plan of care including therapy orders, medications, referrals or requested tests.  ?? You do not receive appointments for recommended tests or treatments in a timely manner. Please do not wait until your next appointment.    ?? You have completed any testing and have not heard back from us with the results after 3 business days.  ?? Your symptoms fail to improve or worsen with the recommended treatment(s), please call and if needed we may request to see you sooner than scheduled or make adjustments to your plan of care.   ?? You need a medication refilled before your next appointment, please plan ahead and call at least 4 days in advance whenever possible.  Keep in mind any upcoming holidays allowing 1 week advance notice around the holidays, in case the office is closed.  Please bring a list of medications you need refilled to your appointments as it is easier to take care of it at that time.   ?? If you are unable to attend your next scheduled appointment, kindly provide us the courtesy of at least 24 hours notice of the cancellation.  You will receive an automated reminder call before your next appointment.    Please be aware that unless advised otherwise by you,  a copy of your office note will be forwarded to your primary care provider and/or the specialist who referred you here.  Your treatment will likely involve a team of doctors and medical professionals working together and it is important for continuity of care to keep your primary care physician involved in your treatment plan.   If you have additional physicians involved in your care that you would like us to keep informed, just let us know.      Thank you for allowing us to participate  in your health care needs.  We look forward to partnering with you in your health care needs.

## 2014-08-06 ENCOUNTER — Encounter: Primary: Family Medicine

## 2014-08-11 ENCOUNTER — Inpatient Hospital Stay: Admit: 2014-08-11 | Primary: Family Medicine

## 2014-08-11 NOTE — Other (Signed)
[name]. [name] [facility]  Phone: [phone] Fax: [phone]        Physical Therapy Aquatic Flow Sheet   Date:  08/11/2014    Patient Name:  Andrea PiqueLaticia Pitts    DOB:  December 04, 1965  Restrictions/Precautions:  Visual Impairment - Pt can see shadows and reports having cloudy vision.     Diagnosis:  Lumbar Stenosis   Treatment Diagnosis:    Insurance/Certification information:  [facility]   Referring Physician:  Dr. Altamese CabalKopey   Plan of care signed (Y/N):    Visit# / total visits:  712  2 x week for 6-8 weeks   Pain level: 5/10 -LB Electronically signed by:  Arlyn Leakawn Anuja Manka, PTA [id number]  Time In:1330  Time Out:1430    Subjective:  Pt reports "low back is 5/10 this afternoon."  Mother present and performs aquatic session.    Key-HH=hand held assist  B= Belt DB= Dumbells T= Theratube   H= Hydrotone N= Noodles W= Weights   P= Paddles S= Speedo equipment K= Kickboard     Exercises/Activities   Warm-up/Amb  1 hand bar  Exercises      Slow forward   full bar 5x   HR/TR  20x2    Slow sideways  full bar 6 laps  Marches  20x2    Slow backwards  full bar 5x   Mini-squats  202       4-way SLR  10x each   Walking laps around entire pool w/ PTA  4x-did well  Hip circles/fig 8         Hamstring curls      Jog    Knee extension      Braiding    Pelvic tilts      Bicycling  step x 5 minutes  Scap squeezes          Shoulder flex/ext      Functional    Shoulder abd/add      Step    Shoulder H. abd/add      Lifting    Shoulder IR/ER      Hand to opp knee    Rowing      Push down squat    Bilateral pull down      UE PNF    Push/pull      LE PNF    Push downs      Wall push ups    Arm circles      SLS    Elbow flex/ext          Chin tuck      Stretching    UT shrugs/rolls      Gastroc/Soleus    Rocking horse      Hamstring          SKTC    Other      Piriformis          Hip flexor          Ladder pull          Pec stretch          Post deltoid           Timed Code Treatment Minutes:  50  Total Treatment Minutes:   60    Treatment/Activity Tolerance:  [x]  Patient tolerated treatment well []  Patient limited by fatique  []  Patient limited by pain []  Patient limited by other medical complications  [x]  Other: Pt shows increased confidence and endurance this visit. She self initiates her exercises and  is able to increase her walking laps without complication.    Prognosis: []  Good [x]  Fair  []  Poor    Patient Requires Follow-up: [x]  Yes  []  No    Plan:   [x]  Continue per plan of care []  Alter current plan (see comments)  []  Plan of care initiated []  Hold pending MD visit []  Discharge        Electronically signed by:  Arlyn Leakawn Daniela Hernan PTA [phone]

## 2014-08-13 ENCOUNTER — Inpatient Hospital Stay: Admit: 2014-08-13 | Primary: Family Medicine

## 2014-08-13 NOTE — Other (Signed)
[name]. [name] [facility]  Phone: [phone] Fax: [phone]        Physical Therapy Aquatic Flow Sheet   Date:  08/13/2014    Patient Name:  Andrea Pitts    DOB:  [birth year]  Restrictions/Precautions:  Visual Impairment - Pt can see shadows and reports having cloudy vision.     Diagnosis:  Lumbar Stenosis   Treatment Diagnosis:    Insurance/Certification information:  [facility]   Referring Physician:  Dr. Altamese CabalKopey   Plan of care signed (Y/N):    Visit# / total visits:  712  2 x week for 6-8 weeks   Pain level: 5/10 -LB Electronically signed by:  Arlyn Leakawn Shiva Sahagian, PTA [id number]  Time In:1330  Time Out:1430    Subjective:  Pt reports "low back is 5/10 this afternoon."  Mother present and performs aquatic session.    Key-HH=hand held assist  B= Belt DB= Dumbells T= Theratube   H= Hydrotone N= Noodles W= Weights   P= Paddles S= Speedo equipment K= Kickboard     Exercises/Activities   Warm-up/Amb  1 hand bar  Exercises      Slow forward   full bar 5x   HR/TR  20x2    Slow sideways  full bar 6 laps  Marches  20x2    Slow backwards  full bar 5x   Mini-squats  202       4-way SLR  10x each   Walking laps around entire pool Indep  6x-did well  Hip circles/fig 8         Hamstring curls      Jog    Knee extension      Braiding    Pelvic tilts      Bicycling  step x 5 minutes  Scap squeezes          Shoulder flex/ext      Functional    Shoulder abd/add      Step    Shoulder H. abd/add      Lifting    Shoulder IR/ER      Hand to opp knee    Rowing      Push down squat    Bilateral pull down      UE PNF    Push/pull      LE PNF    Push downs      Wall push ups    Arm circles      SLS    Elbow flex/ext          Chin tuck      Stretching    UT shrugs/rolls      Gastroc/Soleus    Rocking horse      Hamstring          SKTC    Other      Piriformis          Hip flexor          Ladder pull          Pec stretch          Post deltoid           Timed Code Treatment Minutes:  50  Total Treatment Minutes:   60    Treatment/Activity Tolerance:  [x]  Patient tolerated treatment well []  Patient limited by fatique  []  Patient limited by pain []  Patient limited by other medical complications  [x]  Other: Due to increased confidence and balance pt no longer needs HHA from PTA-Pt able to  independently navigate the pool.  She self initiates her exercises and is able to increase her walking laps without complication.    Prognosis: []  Good [x]  Fair  []  Poor    Patient Requires Follow-up: [x]  Yes  []  No    Plan:   [x]  Continue per plan of care []  Alter current plan (see comments)  []  Plan of care initiated []  Hold pending MD visit []  Discharge        Electronically signed by:  Arlyn Leakawn Shakisha Abend PTA [phone]

## 2014-08-18 ENCOUNTER — Inpatient Hospital Stay: Primary: Family Medicine

## 2014-08-18 NOTE — Other (Signed)
[name]. [name]  Phone: [phone] Fax: [phone]     Physical Therapy  Cancellation/No-show Note  Patient Name:  Andrea Pitts  DOB:  [birth year]   Date:  08/18/2014    For today's appointment patient:  [x]   Cancelled  []   Rescheduled appointment  []   No-show     Reason given by patient:  []   Patient ill  []   Conflicting appointment  []   No transportation    []   Conflict with work  []   No reason given  []   Other:   Electronically signed by:  Arlyn Leakawn Yaniyah Koors PTA [phone]

## 2014-08-20 ENCOUNTER — Inpatient Hospital Stay: Primary: Family Medicine

## 2014-08-20 NOTE — Other (Signed)
[name]. [name]  Phone: [phone] Fax: [phone]     Physical Therapy  Cancellation/No-show Note  Patient Name:  Andrea PiqueLaticia Pitts  DOB:  1966/05/22   Date:  08/20/2014    For today's appointment patient:  [x]   No-show     Reason given by patient:  []   Patient ill  []   Conflicting appointment  []   No transportation    []   Conflict with work  []   No reason given  []   Other:   Electronically signed by:  Arlyn Leakawn Acea Yagi PTA [phone]

## 2014-08-25 ENCOUNTER — Inpatient Hospital Stay: Primary: Family Medicine

## 2014-08-25 NOTE — Other (Signed)
[name]. [name]  Phone: [phone] Fax: [phone]     Physical Therapy  Cancellation/No-show Note  Patient Name:  Andrea PiqueLaticia Capraro  DOB:  1966-07-03   Date:  08/25/2014    For today's appointment patient:  [x]   No-show     Reason given by patient:  []   Patient ill  []   Conflicting appointment  []   No transportation    []   Conflict with work  []   No reason given  []   Other:   Electronically signed by:  Arlyn Leakawn Graviel Payeur PTA [phone]

## 2014-09-01 ENCOUNTER — Encounter: Primary: Family Medicine

## 2014-09-11 ENCOUNTER — Encounter: Payer: Medicare (Managed Care) | Attending: Physical Medicine & Rehabilitation | Primary: Family Medicine

## 2014-12-15 ENCOUNTER — Inpatient Hospital Stay: Admit: 2014-12-15 | Primary: Family Medicine
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
## 2014-12-15 NOTE — Other (Signed)
[name]. [name]   Phone: [phone] Fax: [phone]     Physical Therapy  Out Patient Initial Evaluation    Date:  12/15/2014     Patient Name:  Andrea Pitts    DOB:  Jan 15, 1966  MRN: [id number]    DIAGNOSIS:  Back Pain   EVALUATION DATE:  12/15/2014   REFERRING PHYSICIAN:  Dr. Vicente MalesWilner   ONSET DATE:  12/01/2014    PROBLEMS FOUND DURING EVALUATION  ?? Back Pain: Pain rated Constant Pain aggravated by attempts to maintain statis positions or with prolonged walking.   ?? Radicular pain/altered sensation bilateral LE's   ?? Deficits of ROM/Mobility trunk/hips  ?? Deficits of strength trunk/core and LE's   ?? Postural Deficits     SHORT TERM GOALS  ?? Patient will be able to engage in consistent active exercise while reporting no significant or sustained increase in back or radicular pain symptoms   ?? Establish HEP    LONG TERM GOALS  ?? Patient will be able to engage in consistent active ADL's and ambulation while being able to effectively and consistently control back pain at 3/10 or less. This will include being able to sustain static postures for functional time frames as needed   ?? Radicular Pain symptoms will be occasional or less frequent and minimal in intensity   ?? ROM/Mobility trunk and hips wil be WFL's   ?? Strength Trunk/core stability muscles 4- to 4/5   ?? Postural Awareness Fair or better  ?? Patient will be able to maintain and self direct a follow up independent exercise program     PATIENT GOALS  ?? Control back pain     REHAB POTENTIAL:  Fair    FREQUENCY/DURATION:  2 times per week 4-6 weeks     PLAN OF CARE:  Modalities Progressive exercise trunk/core strengthening HEP Aquatic based exercise Postural education       Thank you for the opportunity to work with your patient. If you have questions or comments, please feel free to contact me by phone or fax.      Electronically Signed by Stann Mainlandonald Mckynzie Liwanag  PT [phone]        ___________________________________  12/15/2014      Physician     Date

## 2014-12-17 ENCOUNTER — Inpatient Hospital Stay: Admit: 2014-12-17 | Primary: Family Medicine

## 2014-12-17 NOTE — Progress Notes (Signed)
[name]  Phone: [phone] Fax: [phone]       Physical Therapy Daily Treatment Note  Date:  12/17/2014    Patient Name:  Andrea Pitts    DOB:  1966-05-16  MRN: [id number]    Restrictions/Precautions:    Diagnosis:    Treatment Diagnosis:  Back Pain   Insurance/Certification information:  healthspan medicare   Referring Physician:  Dr. Vicente MalesWilner   Plan of care signed (Y/N):    Visit# / total visits:  2/  Pain level: /10  Time In:      1030  Time Out:    1130      Subjective:      Exercises:  Exercise/Equipment Resistance/Repetitions Other comments   Seated Flex with ball 5 reps      Lateral flex w/ball  5 reps bilateral       Standing trunk flex/ext  5 reps       Standing lateral trunk flex 5 reps bilateral       Supine LTR 5 reps bilateral      Supine Isometric Hip flex  5 reps       Toe raise 10 reps       Lateral walk  4 reps       Sit to stand 5 reps 2 sets    Bike 10 min                                                                       Other Therapeutic Activities:      Home Exercise Program:      Manual Treatments:      Modalities:  Heat post exercise 15 min     Timed Code Treatment Minutes:  30    Total Treatment Minutes:  50    Treatment/Activity Tolerance:  [x]  Patient tolerated treatment well []  Patient limited by fatique  []  Patient limited by pain  []  Patient limited by other medical complications  []  Other:     Prognosis: []  Good [x]  Fair  []  Poor    Patient Requires Follow-up: [x]  Yes  []  No    Plan:   [x]  Continue per plan of care []  Alter current plan (see comments)  []  Plan of care initiated []  Hold pending MD visit []  Discharge  Plan for Next Session:         Electronically signed by:  Stann Mainlandonald Charell Faulk, PT [phone]

## 2014-12-22 ENCOUNTER — Inpatient Hospital Stay: Admit: 2014-12-22 | Primary: Family Medicine

## 2014-12-22 DIAGNOSIS — L259 Unspecified contact dermatitis, unspecified cause: Secondary | ICD-10-CM

## 2014-12-22 NOTE — Discharge Instructions (Signed)
Dermatitis: Care Instructions  Your Care Instructions  Dermatitis is the general name used for any rash or inflammation of the skin. Different kinds of dermatitis cause different kinds of rashes. Common causes of a rash include new medicines, plants (such as poison oak or poison ivy), heat, stress, and allergies to soaps, cosmetics, detergents, chemicals, and fabrics. Certain illnesses can also cause a rash. Unless caused by an infection, these rashes cannot be spread from person to person.  How long your rash will last depends on what caused it. Rashes may last a few days or months.  Follow-up care is a key part of your treatment and safety. Be sure to make and go to all appointments, and call your doctor if you are having problems. It's also a good idea to know your test results and keep a list of the medicines you take.  How can you care for yourself at home?   Do not scratch. Cut your nails short, and file them smooth. Or you may wear gloves if this helps keep you from scratching.   Wash the area with water only. Pat dry.   Put cold, wet cloths on the rash to reduce itching.   Keep cool, and stay out of the sun.   Leave the rash open to the air as much as possible.   If the rash itches, use hydrocortisone cream. Follow the directions on the label. Calamine lotion may help for plant rashes.   Try an over-the-counter antihistamine such as diphenhydramine (Benadryl) or chlorpheniramine (Chlor-Trimeton). Follow the directions on the label.   If your doctor prescribed a cream, use it as directed. If your doctor prescribed medicine, take it exactly as directed.  When should you call for help?  Call your doctor now or seek immediate medical care if:   You have signs of infection, such as:   Increased pain, swelling, warmth, or redness.   Red streaks leading from the area.   Pus draining from the area.   A fever.   You have joint pain along with the rash.  Watch closely for changes in your health, and be  sure to contact your doctor if:   Your rash is changing or getting worse.   You are not getting better as expected.   Where can you learn more?   Go to [email or web address] and sign in to your MyChart account. Enter F270 in the Search Health Information box to learn more about "Dermatitis: Care Instructions."    If you do not have an account, please click on the "Sign Up Now" link.      2006-2015 [employer]. Care instructions adapted under license by [facility]. This care instruction is for use with your licensed healthcare professional. If you have questions about a medical condition or this instruction, always ask your healthcare professional. Healthwise, Incorporated disclaims any warranty or liability for your use of this information.  Content Version: 10.6.465758; Current as of: October 24, 2013

## 2014-12-22 NOTE — ED Notes (Signed)
Pt complains of itchy rash to bilateral arms, right side of neck.     Andrea EmmsJill Marie Emalynn Clewis, RN  12/22/14 2036

## 2014-12-22 NOTE — ED Provider Notes (Signed)
[facility]  ??  Department of Emergency Medicine   ED  Provider Note  Admit Date/RoomTime: No admission date for patient encounter.  ED Room: Room/bed info not found   Chief Complaint   Rash    History of Present Illness   Source of history provided by:  patient.  History/Exam Limitations: none.      Andrea Pitts is a 49 y.o. old female with has a past medical history of:   Past Medical History   Diagnosis Date   ??? Hypertension    ??? 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 Pseudotumor cerebri    ??? Asthma     presents to the emergency department by private vehicle, for complaint of gradual onset red, raised and itchy area on right arm and neck which began several day(s) prior to arrival.  The symptoms were caused by unknown cause.  Since onset the symptoms have been persistent.      Her symptoms are associated with itching and relieved by nothing.  She denies any SOB, swelling in the mouth or throat or other concerns..    ROS    Pertinent positives and negatives are stated within HPI, all other systems reviewed and are negative.    Past Surgical History   Procedure Laterality Date   ??? Cholecystectomy     ??? Hysterectomy     ??? Tonsillectomy     Social History:  reports that she has never smoked. She does not have any smokeless tobacco history on file. She reports that she does not drink alcohol.  Family History: family history is not on file.   Allergies: Penicillins    Physical Exam         VS:  BP 142/78 mmHg   Pulse 85   Temp(Src) 99.6 ??F (37.6 ??C) (Oral)   Resp 18   Ht  (1.626 m)   Wt 285 lb (129.275 kg)   BMI 48.90 kg/m2   SpO2 98%   LMP 06/04/2009   Oxygen Saturation Interpretation: Normal.    Constitutional:  Alert, development consistent with age.  HEENT:  NC/NT.  Airway patent  Ears: External canals clear without exudate.  Mouth:  Mucous membranes moist without lesions, tongue and gums normal.  Throat:  Airway patient.  Neck:  Supple.  No lymphadenopathy.  Respiratory:  Clear to auscultation and breath  sounds equal.  CV:  Regular rate and rhythm.  Integument:  Skin turgor: Normal.              Erythematous, papular rash to the right upper extremity and right lateral neck. Consistent with contact dermatitis.  Neurological:  Orientation age-appropriate unless noted elseware.  Motor functions intact.    Lab / Imaging Results   (All laboratory and radiology results have been personally reviewed by myself)  Labs:  No results found for this visit on 12/22/14.    Imaging:  All Radiology results interpreted by Radiologist unless otherwise noted.  No orders to display       ED Course / Medical Decision Making   Medications - No data to display  Consults:   None    Procedures:   none    MDM:   This patient was evaluated and found to be in stable condition. She was advised to use mentation as prescribed and follow-up with her PCP.    Counseling:    The emergency provider has spoken with the patient and discussed today???s results, in addition to providing specific details for  the plan of care and counseling regarding the diagnosis and prognosis.  Questions are answered at this time and they are agreeable with the plan.  Assessment      1. Contact dermatitis, contact dermatitis due to unspecified agent, unspecified contact dermatitis      Plan   Discharge to home  Patient condition is stable    New Medications     New Prescriptions    HYDROXYZINE (VISTARIL) 50 MG CAPSULE    Take 1 capsule by mouth 3 times daily as needed for Itching    PREDNISONE (DELTASONE) 20 MG TABLET    Sig.: Take 60mg   Po qd x 3 days, then 40mg  po qd x3 days, then 20mg  po qd x 3 days.  QS x 9 days     Electronically signed by Casimer LeekMelissa M Reyli Schroth, PA   DD: 12/22/14  **This report was transcribed using voice recognition software. Every effort was made to ensure accuracy; however, inadvertent computerized transcription errors may be present.  END OF ED PROVIDER NOTE        Casimer LeekMelissa M Mahin Guardia, GeorgiaPA  12/22/14 2038

## 2014-12-22 NOTE — Progress Notes (Signed)
[name]. [name]  Phone: [phone] Fax: [phone]       Physical Therapy Daily Treatment Note  Date:  12/22/2014    Patient Name:  Andrea Pitts    DOB:  [birth year]  MRN: [id number]    Restrictions/Precautions:    Diagnosis:    Treatment Diagnosis:  Back Pain   Insurance/Certification information:  healthspan medicare   Referring Physician:  Dr. Vicente MalesWilner   Plan of care signed (Y/N):    Visit# / total visits:  3/  Pain level: /10  Time In:      1010  Time Out:    1110      Subjective:  Patient reports she "felt it" in regards to the exercise she did last session But no other issues noted     Exercises:  Exercise/Equipment Resistance/Repetitions Other comments   Seated Flex with ball 5 reps      Lateral flex w/ball  5 reps bilateral       Standing trunk flex/ext  5 reps       Standing lateral trunk flex 5 reps bilateral       Supine LTR 5 reps bilateral      Supine Isometric Hip flex  5 reps  nt     Toe raise 10 reps       Lateral walk  4 reps       Sit to stand 10 reps     Bike 10 min                                                                       Other Therapeutic Activities:      Home Exercise Program:      Manual Treatments:      Modalities:  Heat post exercise 15 min     Timed Code Treatment Minutes:  30    Total Treatment Minutes:  50    Treatment/Activity Tolerance:  [x]  Patient tolerated treatment well []  Patient limited by fatique  []  Patient limited by pain  []  Patient limited by other medical complications  []  Other:     Prognosis: []  Good [x]  Fair  []  Poor    Patient Requires Follow-up: [x]  Yes  []  No    Plan:   [x]  Continue per plan of care []  Alter current plan (see comments)  []  Plan of care initiated []  Hold pending MD visit []  Discharge  Plan for Next Session:         Electronically signed by:  Stann Mainlandonald Oliana Gowens, PT [phone]

## 2014-12-23 ENCOUNTER — Inpatient Hospital Stay: Admit: 2014-12-23 | Discharge: 2014-12-23 | Disposition: A | Discharge status: Home / Self Care

## 2014-12-23 MED ORDER — PREDNISONE 20 MG PO TABS
20 MG | ORAL_TABLET | ORAL | Status: AC
Start: 2014-12-23 — End: 2015-01-01

## 2014-12-23 MED ORDER — HYDROXYZINE PAMOATE 50 MG PO CAPS
50 MG | ORAL_CAPSULE | Freq: Three times a day (TID) | ORAL | Status: AC | PRN
Start: 2014-12-23 — End: 2014-12-29

## 2014-12-23 NOTE — Care Coordination-Inpatient (Signed)
Attempted follow up call for ED visit on 12/23/14 for Contact dermatitis, contact dermatitis due to unspecified agent, unspecified contact dermatitis.  Left message on voice mail along with contact information.

## 2014-12-24 ENCOUNTER — Inpatient Hospital Stay: Admit: 2014-12-24 | Primary: Family Medicine

## 2014-12-24 NOTE — Progress Notes (Signed)
Andrea Pitts. [name]  Phone: [phone] Fax: [phone]       Physical Therapy Daily Treatment Note  Date:  12/24/2014    Patient Name:  Andrea Pitts    DOB:  03-25-1966  MRN: [id number]    Restrictions/Precautions:    Diagnosis:    Treatment Diagnosis:  Back Pain   Insurance/Certification information:  healthspan medicare   Referring Physician:  Dr. Vicente MalesWilner   Plan of care signed (Y/N):    Visit# / total visits:  4/  Pain level: /10  Time In:      1010  Time Out:    1110      Subjective:    Exercises:  Exercise/Equipment Resistance/Repetitions Other comments   Seated Flex with ball 5 reps      Lateral flex w/ball  5 reps bilateral       Standing trunk flex/ext  5 reps       Standing lateral trunk flex 5 reps bilateral       Supine LTR 5 reps bilateral      Supine Isometric Hip flex  5 reps  nt     Toe raise 10 reps       Lateral walk  4 reps       Sit to stand 10 reps     Bike 10 min                                                                       Other Therapeutic Activities:      Home Exercise Program:      Manual Treatments:      Modalities:  Heat post exercise 15 min     Timed Code Treatment Minutes:  30    Total Treatment Minutes:  50    Treatment/Activity Tolerance:  [x]  Patient tolerated treatment well []  Patient limited by fatique  []  Patient limited by pain  []  Patient limited by other medical complications  [x]  Other: Patient moves well from one exercise to the other No overt pain behavior or postural guarding noted Gait is less guarded     Prognosis: []  Good [x]  Fair  []  Poor    Patient Requires Follow-up: [x]  Yes  []  No    Plan:   [x]  Continue per plan of care []  Alter current plan (see comments)  []  Plan of care initiated []  Hold pending MD visit []  Discharge  Plan for Next Session:         Electronically signed by:  Stann Mainlandonald Dereonna Lensing, PT [phone]

## 2014-12-28 ENCOUNTER — Inpatient Hospital Stay: Admit: 2014-12-28 | Primary: Family Medicine

## 2014-12-28 NOTE — Progress Notes (Signed)
[name]. [name]  Phone: [phone] Fax: [phone]       Physical Therapy Daily Treatment Note  Date:  12/28/2014    Patient Name:  Andrea Pitts    DOB:  [birth year]  MRN: [id number]    Restrictions/Precautions:    Diagnosis:    Treatment Diagnosis:  Back Pain   Insurance/Certification information:  healthspan medicare   Referring Physician:  Dr. Vicente MalesWilner   Plan of care signed (Y/N):    Visit# / total visits:  5/  Pain level: /10  Time In:      1110  Time Out:    1200      Subjective:    Exercises:  Exercise/Equipment Resistance/Repetitions Other comments   Seated Flex with ball 5 reps      Lateral flex w/ball  5 reps bilateral       Standing trunk flex/ext  5 reps       Standing lateral trunk flex 5 reps bilateral       Supine LTR 5 reps bilateral      Supine Isometric Hip flex  5 reps  nt     Toe raise 10 reps       Lateral walk  4 reps       Sit to stand 10 reps     Bike 10 min                                                                       Other Therapeutic Activities:      Home Exercise Program:      Manual Treatments:      Modalities:  Heat post exercise 15 min     Timed Code Treatment Minutes:  30    Total Treatment Minutes:  50    Treatment/Activity Tolerance:  [x]  Patient tolerated treatment well []  Patient limited by fatique  []  Patient limited by pain  []  Patient limited by other medical complications  []  Other:     Prognosis: []  Good [x]  Fair  []  Poor    Patient Requires Follow-up: [x]  Yes  []  No    Plan:   [x]  Continue per plan of care []  Alter current plan (see comments)  []  Plan of care initiated []  Hold pending MD visit []  Discharge  Plan for Next Session:         Electronically signed by:  Stann Mainlandonald Zophia Marrone, PT [phone]

## 2014-12-29 LAB — LIPID PANEL
Cholesterol, Total: 178 mg/dL (ref 0–199)
HDL: 50 mg/dL (ref >40)
LDL Calculated: 100 mg/dL — ABNORMAL HIGH (ref 0–99)
Triglycerides: 139 mg/dL (ref 0–149)
VLDL Cholesterol Calculated: 28 mg/dL

## 2014-12-29 LAB — CBC
Hematocrit: 40.2 % (ref 34.0–48.0)
Hemoglobin: 13.3 g/dL (ref 11.5–15.5)
MCH: 29.9 pg (ref 26.0–35.0)
MCHC: 33.1 % (ref 32.0–34.5)
MCV: 90.2 fL (ref 80.0–99.9)
MPV: 6.7 fL — ABNORMAL LOW (ref 7.0–12.0)
Platelets: 357 E9/L (ref 130–450)
RBC: 4.46 E12/L (ref 3.50–5.50)
RDW: 13.8 fL (ref 11.5–15.0)
WBC: 11.4 E9/L (ref 4.5–11.5)

## 2014-12-29 LAB — BASIC METABOLIC PANEL
Anion Gap: 18 mmol/L — ABNORMAL HIGH (ref 7–16)
BUN: 21 mg/dL — ABNORMAL HIGH (ref 6–20)
CO2: 22 mmol/L (ref 22–29)
Calcium: 9.2 mg/dL (ref 8.6–10.2)
Chloride: 103 mmol/L (ref 98–107)
Creatinine: 1.3 mg/dL — ABNORMAL HIGH (ref 0.5–1.0)
GFR African American: 53
GFR Non-African American: 53 mL/min/{1.73_m2} (ref >=60)
Glucose: 76 mg/dL (ref 74–109)
Potassium: 3.2 mmol/L — ABNORMAL LOW (ref 3.5–5.0)
Sodium: 143 mmol/L (ref 132–146)

## 2014-12-31 ENCOUNTER — Inpatient Hospital Stay: Admit: 2014-12-31 | Primary: Family Medicine

## 2014-12-31 NOTE — Progress Notes (Signed)
[name]. [name]  Phone: [phone] Fax: [phone]       Physical Therapy Daily Treatment Note  Date:  12/31/2014    Patient Name:  Andrea PiqueLaticia Pitts    DOB:  June 13, 1966  MRN: [id number]    Restrictions/Precautions:    Diagnosis:    Treatment Diagnosis:  Back Pain   Insurance/Certification information:  healthspan medicare   Referring Physician:  Dr. Vicente MalesWilner   Plan of care signed (Y/N):    Visit# / total visits:  6/  Pain level: /10  Time In:      1000  Time Out:    1110      Subjective:    Exercises:  Exercise/Equipment Resistance/Repetitions Other comments   Seated Flex with ball 5 reps      Lateral flex w/ball  5 reps bilateral       Standing trunk flex/ext  5 reps       Standing lateral trunk flex 5 reps bilateral       Supine LTR 5 reps bilateral      Supine Isometric Hip flex  5 reps  nt     Toe raise 10 reps       Lateral walk  4 reps       Sit to stand 10 reps     Bike 10 min                                                                       Other Therapeutic Activities:      Home Exercise Program:      Manual Treatments:      Modalities:  Heat post exercise 15 min     Timed Code Treatment Minutes:  30    Total Treatment Minutes:  50    Treatment/Activity Tolerance:  [x]  Patient tolerated treatment well []  Patient limited by fatique  []  Patient limited by pain  []  Patient limited by other medical complications  [x]  Other: patient reports being bothered by weather change but otherwise doing OK today    Prognosis: []  Good [x]  Fair  []  Poor    Patient Requires Follow-up: [x]  Yes  []  No    Plan:   [x]  Continue per plan of care []  Alter current plan (see comments)  []  Plan of care initiated []  Hold pending MD visit []  Discharge  Plan for Next Session:         Electronically signed by:  Stann Mainlandonald Kealii Thueson, PT [phone]

## 2015-01-26 ENCOUNTER — Inpatient Hospital Stay: Admit: 2015-01-26 | Payer: Medicare (Managed Care) | Primary: Family Medicine

## 2015-01-26 NOTE — Progress Notes (Signed)
[name]. [name]  Phone: [phone] Fax: [phone]       Physical Therapy Daily Treatment Note  Date:  01/26/2015    Patient Name:  Andrea Pitts    DOB:  Nov 11, 1965  MRN: [id number]    Restrictions/Precautions:    Diagnosis:    Treatment Diagnosis:  Back Pain   Insurance/Certification information:  [employer] medicare   Referring Physician:  Dr. Vicente MalesWilner   Plan of care signed (Y/N):    Visit# / total visits:  7/  Pain level: /10  Time In:      1000  Time Out:    1110      Subjective:    Exercises:  Exercise/Equipment Resistance/Repetitions Other comments   Seated Flex with ball 5 reps      Lateral flex w/ball  5 reps bilateral       Standing trunk flex/ext  5 reps       Standing lateral trunk flex 5 reps bilateral       Supine LTR 5 reps bilateral      Supine Isometric Hip flex  5 reps  nt     Toe raise 10 reps       Lateral walk  4 reps       Sit to stand 10 reps     Bike 10 min                                                                       Other Therapeutic Activities:      Home Exercise Program:      Manual Treatments:      Modalities:  Heat post exercise 15 min     Timed Code Treatment Minutes:  30    Total Treatment Minutes:  50    Treatment/Activity Tolerance:  [x]  Patient tolerated treatment well []  Patient limited by fatique  []  Patient limited by pain  []  Patient limited by other medical complications  []  Other:     Prognosis: []  Good [x]  Fair  []  Poor    Patient Requires Follow-up: [x]  Yes  []  No    Plan:   [x]  Continue per plan of care []  Alter current plan (see comments)  []  Plan of care initiated []  Hold pending MD visit []  Discharge  Plan for Next Session:         Electronically signed by:  Stann Mainlandonald Chancie Lampert, PT [phone]

## 2015-01-28 ENCOUNTER — Inpatient Hospital Stay: Payer: Medicare (Managed Care) | Primary: Family Medicine

## 2015-01-28 ENCOUNTER — Encounter: Primary: Family Medicine

## 2015-01-28 NOTE — Other (Signed)
[name]. [name]  Phone: [phone] Fax: [phone]     Physical Therapy  Cancellation/No-show Note  Patient Name:  Andrea Pitts  DOB:  12/04/1965   Date:  01/28/2015    For today's appointment patient:  [x]   Cancelled  []   Rescheduled appointment  []   No-show     Reason given by patient:  []   Patient ill  []   Conflicting appointment  []   No transportation    []   Conflict with work  []   No reason given  []   Other:     Comments:      Electronically signed by:  Gar Gibbonnne Marie Daune [employer],LPTA  [id number]

## 2015-01-28 NOTE — Other (Signed)
Andrea Pitts. Andrea Pitts [facility]  Phone: [phone] Fax: [phone]    Physical Therapy  Out Patient Discharge Summary     Date:  01/28/2015    Patient Name:  Andrea Pitts    DOB:  1966/04/21  MRN: [id number]    DIAGNOSIS:  Back Pain   REFERRING PHYSICIAN:  Dr. Vicente MalesWilner    ATTENDANCE:  Pt has attended 7 of 7 scheduled treatments from 12/15/2014   To 01/28/2015 .  TREATMENTS RECEIVED:  Active exercise with emphasis on ROM flexibility and strength of the trunk and LE's     INITIAL STATUS:  ?? Back Pain: Rated Constant   ?? Radicular Pain/Altered sensation   ?? Deficits of ROM/Mobility trunk/ and hips  ?? Strength Deficits trunk/core region   ?? Postural deficits    FINAL STATUS:  ?? At time of last treatment 01/26/2015, patient was reporting pain was now intermittent but still was limiting her ADL's and mobility Patient demonstrated ROM that was WFL's trunk and LE's Strength trunk/core region 3+ to 4-/5   ?? Final status was not determined as patient chose to self discharge from PT at this time     GOALS:  0 out of 6 Long Term Goals were obtained.    LONG TERM GOALS NOT OBTAINED/REASON: Patient self discharged     PATIENT GOALS: Control back pain     REASON FOR DISCHARGE: Patient chose to discontinue PT No reason given     PATIENT EDUCATION/INSTRUCTIONS: NA    RECOMMENDATIONS: Discharge from PT         Thank you for the opportunity to work with your patient. If you have questions or comments, please feel free to contact me by phone or fax.      Electronically Signed by: Stann Mainlandonald Icesis Renn  01/28/2015

## 2015-03-29 NOTE — Telephone Encounter (Signed)
Medicare Part D Medication Therapy Management Program  Per verbal reply from caregiver, mom. Disenrolled from MTM program per their request.

## 2015-04-07 LAB — BASIC METABOLIC PANEL
Anion Gap: 14 mmol/L (ref 7–16)
BUN: 15 mg/dL (ref 6–20)
CO2: 23 mmol/L (ref 22–29)
Calcium: 9.4 mg/dL (ref 8.6–10.2)
Chloride: 102 mmol/L (ref 98–107)
Creatinine: 1.3 mg/dL — ABNORMAL HIGH (ref 0.5–1.0)
GFR African American: 53
GFR Non-African American: 53 mL/min/{1.73_m2} (ref >=60)
Glucose: 71 mg/dL — ABNORMAL LOW (ref 74–109)
Potassium: 3.8 mmol/L (ref 3.5–5.0)
Sodium: 139 mmol/L (ref 132–146)

## 2015-07-07 ENCOUNTER — Inpatient Hospital Stay
Admit: 2015-07-07 | Discharge: 2015-07-07 | Disposition: A | Discharge status: Home / Self Care | Attending: Emergency Medicine

## 2015-07-07 ENCOUNTER — Emergency Department: Admit: 2015-07-07 | Primary: Family Medicine

## 2015-07-07 DIAGNOSIS — R079 Chest pain, unspecified: Secondary | ICD-10-CM

## 2015-07-07 LAB — CBC WITH AUTO DIFFERENTIAL
Basophils %: 0 % (ref 0–2)
Basophils Absolute: 0.03 E9/L (ref 0.00–0.20)
Eosinophils %: 3 % (ref 0–6)
Eosinophils Absolute: 0.3 E9/L (ref 0.05–0.50)
Hematocrit: 39 % (ref 34.0–48.0)
Hemoglobin: 12.8 g/dL (ref 11.5–15.5)
Lymphocytes %: 28 % (ref 20–42)
Lymphocytes Absolute: 3.38 E9/L (ref 1.50–4.00)
MCH: 29.4 pg (ref 26.0–35.0)
MCHC: 32.9 % (ref 32.0–34.5)
MCV: 89.5 fL (ref 80.0–99.9)
MPV: 6.5 fL — ABNORMAL LOW (ref 7.0–12.0)
Monocytes %: 6 % (ref 2–12)
Monocytes Absolute: 0.7 E9/L (ref 0.10–0.95)
Neutrophils %: 64 % (ref 43–80)
Neutrophils Absolute: 7.75 E9/L — ABNORMAL HIGH (ref 1.80–7.30)
Platelets: 316 E9/L (ref 130–450)
RBC: 4.36 E12/L (ref 3.50–5.50)
RDW: 13.6 fL (ref 11.5–15.0)
WBC: 12.2 E9/L — ABNORMAL HIGH (ref 4.5–11.5)

## 2015-07-07 LAB — COMPREHENSIVE METABOLIC PANEL
ALT: 26 U/L (ref 0–32)
AST: 23 U/L (ref 0–31)
Albumin: 3.9 g/dL (ref 3.5–5.2)
Alkaline Phosphatase: 72 U/L (ref 35–104)
Anion Gap: 14 mmol/L (ref 7–16)
BUN: 21 mg/dL — ABNORMAL HIGH (ref 6–20)
CO2: 21 mmol/L — ABNORMAL LOW (ref 22–29)
Calcium: 9 mg/dL (ref 8.6–10.2)
Chloride: 101 mmol/L (ref 98–107)
Creatinine: 1.1 mg/dL — ABNORMAL HIGH (ref 0.5–1.0)
GFR African American: >60
GFR Non-African American: >60 mL/min/{1.73_m2} (ref >=60)
Glucose: 99 mg/dL (ref 74–109)
Potassium: 3.7 mmol/L (ref 3.5–5.0)
Sodium: 136 mmol/L (ref 132–146)
Total Bilirubin: 0.2 mg/dL (ref 0.0–1.2)
Total Protein: 7.1 g/dL (ref 6.4–8.3)

## 2015-07-07 LAB — TROPONIN: Troponin: <0.01 ng/mL (ref 0.00–0.03)

## 2015-07-07 MED ORDER — KETOROLAC TROMETHAMINE 30 MG/ML IJ SOLN
30 MG/ML | Freq: Once | INTRAMUSCULAR | Status: AC
Start: 2015-07-07 — End: 2015-07-07
  Administered 2015-07-07: 08:00:00 30 mg via INTRAVENOUS

## 2015-07-07 MED FILL — KETOROLAC TROMETHAMINE 30 MG/ML IJ SOLN: 30 MG/ML | INTRAMUSCULAR | Qty: 1

## 2015-07-07 NOTE — ED Provider Notes (Signed)
[facility]    ??  Department of Emergency Medicine   ED  Provider Note  Admit Date/Time: 07/07/2015  1:53 AM  ED Bed: 13/13  MRN: [id number]  Chief Complaint: Chest Pain (Onset 1 hour ago. Left sided pain that radiates into shoulder and neck.)       History of Present Illness   Source of history provided by:  patient.  History/Exam Limitations: none.       Andrea Pitts is a 49 y.o. female who has a past medical history of:   Past Medical History   Diagnosis Date   ??? Asthma    ??? Hypertension    ??? 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 Pseudotumor cerebri     presents to the ED by private car and is accompanied by family member for left chest pain for the past hour and a half. She says it radiates to her shoulder and neck. She denies any history of cardiac problems other than the high blood pressure. She denies any abdominal pain, nausea, vomiting. Denies any fever or chills or recent illness. Denies any cough or shortness of breath or difficulty breathing. Denies any throat pain. She says that her pain is significantly more improved now than it was when it initially started.    ROS    Pertinent positives and negatives are stated within HPI, all other systems reviewed and are negative.    Past Surgical History   Procedure Laterality Date   ??? Cholecystectomy     ??? Hysterectomy     ??? Tonsillectomy     Social History:  reports that she has never smoked. She does not have any smokeless tobacco history on file. She reports that she does not drink alcohol.  Family History: family history is not on file.  Allergies: Penicillins    Physical Exam   Oxygen Saturation Interpretation: Normal.   ED Triage Vitals   BP Temp Temp Source Pulse Resp SpO2 Height Weight   07/07/15 0150 07/07/15 0150 07/07/15 0150 07/07/15 0150 07/07/15 0150 07/07/15 0150 -- --   136/94 97.9 ??F (36.6 ??C) Temporal 89 18 96 %         Physical Exam  ?? Constitutional/General: Alert and oriented x3, well appearing, non toxic in NAD  ?? HEENT:  NC/NT. PERRLA,   Airway patent. Wear sunglasses is visually impaired  ?? Neck: Supple, full ROM, non tender to palpation in the midline, no stridor, no crepitus, no meningeal signs  ?? Respiratory: Lungs clear to auscultation bilaterally, diminished bases  ?? CV:  Regular rate. Regular rhythm.   ?? Chest: No chest wall tenderness  ?? GI:  Abdomen Soft, Non tender, Non distended.  Rounded .+BS.   No rebound, guarding, or rigidity. No pulsatile masses.  ?? Musculoskeletal: Moves all extremities x 4. Warm and well perfused, no clubbing, cyanosis, or edema. Capillary refill <3 seconds  ?? Integument: skin warm and dry. No rashes.   ?? Lymphatic: no lymphadenopathy noted  ?? Neurologic: GCS 15, no focal deficits, symmetric strength 5/5 in the upper and lower extremities bilaterally  ?? Psychiatric: Normal Affect    Lab / Imaging Results   (All laboratory and radiology results have been personally reviewed by myself)  Labs:  Results for orders placed or performed during the hospital encounter of 07/07/15   CBC auto differential   Result Value Ref Range    WBC 12.2 (H) 4.5 - 11.5 E9/L    RBC 4.36 3.50 - 5.50 E12/L  Hemoglobin 12.8 11.5 - 15.5 g/dL    Hematocrit 45.439.0 09.834.0 - 48.0 %    MCV 89.5 80.0 - 99.9 fL    MCH 29.4 26.0 - 35.0 pg    MCHC 32.9 32.0 - 34.5 %    RDW 13.6 11.5 - 15.0 fL    Platelets 316 130 - 450 E9/L    MPV 6.5 (L) 7.0 - 12.0 fL    Neutrophils Relative 64 43 - 80 %    Lymphocytes Relative 28 20 - 42 %    Monocytes Relative 6 2 - 12 %    Eosinophils Relative Percent 3 0 - 6 %    Basophils Relative 0 0 - 2 %    Neutrophils Absolute 7.75 (H) 1.80 - 7.30 E9/L    Lymphocytes Absolute 3.38 1.50 - 4.00 E9/L    Monocytes Absolute 0.70 0.10 - 0.95 E9/L    Eosinophils Absolute 0.30 0.05 - 0.50 E9/L    Basophils Absolute 0.03 0.00 - 0.20 E9/L   Comprehensive Metabolic Panel   Result Value Ref Range    Sodium 136 132 - 146 mmol/L    Potassium 3.7 3.5 - 5.0 mmol/L    Chloride 101 98 - 107 mmol/L    CO2 21 (L) 22 - 29 mmol/L    Anion Gap 14 7 -  16 mmol/L    Glucose 99 74 - 109 mg/dL    BUN 21 (H) 6 - 20 mg/dL    CREATININE 1.1 (H) 0.5 - 1.0 mg/dL    GFR Non-African American >60 >=60 mL/min/1.73    GFR African American >60     Calcium 9.0 8.6 - 10.2 mg/dL    Total Protein 7.1 6.4 - 8.3 g/dL    Alb 3.9 3.5 - 5.2 g/dL    Total Bilirubin 0.2 0.0 - 1.2 mg/dL    Alkaline Phosphatase 72 35 - 104 U/L    ALT 26 0 - 32 U/L    AST 23 0 - 31 U/L   Troponin   Result Value Ref Range    Troponin <0.01 0.00 - 0.03 ng/mL   EKG 12 Lead   Result Value Ref Range    Ventricular Rate 88 BPM    Atrial Rate 88 BPM    P-R Interval 200 ms    QRS Duration 82 ms    Q-T Interval 390 ms    QTc Calculation (Bazett) 471 ms    P Axis 43 degrees    R Axis -5 degrees    T Axis 29 degrees   ekg had no acute findings  Imaging:  All Radiology results interpreted by Radiologist unless otherwise noted.  XR Chest Portable   Final Result   No gross focal consolidation.             ED Course / Medical Decision Making     Medications   ketorolac (TORADOL) injection 30 mg (30 mg Intravenous Given 07/07/15 0342)     ED Course     And off to Dr. Worthy FlankWeilbaker prior to results  Consult(s):   None    Procedure(s):   none    MDM:   Patient's arrival and imaging were normal she did relief her symptoms for medications should be instructed to follow-up with primary doctor as needed    Counseling:    The emergency provider has spoken with the patient and discussed today???s results, in addition to providing specific details for the plan of care and counseling regarding the diagnosis and  prognosis.  Questions are answered at this time and they are agreeable with the plan.    Assessment      1. Chest pain, unspecified type      Plan   Discharge to home  Patient condition is good    New Medications     Discharge Medication List as of 07/07/2015  6:49 AM        Electronically signed by Eunice Blase, CNP   DD: 07/07/15  **This report was transcribed using voice recognition software. Every effort was made to ensure accuracy;  however, inadvertent computerized transcription errors may be present.  END OF ED PROVIDER NOTE        Eunice Blase, CNP  07/07/15 1721

## 2015-07-14 LAB — EKG 12-LEAD
Atrial Rate: 88 {beats}/min
P Axis: 43 degrees
P-R Interval: 200 ms
Q-T Interval: 390 ms
QRS Duration: 82 ms
QTc Calculation (Bazett): 471 ms
R Axis: -5 degrees
T Axis: 29 degrees
Ventricular Rate: 88 {beats}/min

## 2016-02-25 LAB — BASIC METABOLIC PANEL
Anion Gap: 18 mmol/L — ABNORMAL HIGH (ref 7–16)
BUN: 11 mg/dL (ref 6–20)
CO2: 15 mmol/L — ABNORMAL LOW (ref 22–29)
Calcium: 9.3 mg/dL (ref 8.6–10.2)
Chloride: 106 mmol/L (ref 98–107)
Creatinine: 1.2 mg/dL — ABNORMAL HIGH (ref 0.5–1.0)
GFR African American: 57
GFR Non-African American: 57 mL/min/{1.73_m2} (ref >=60)
Glucose: 78 mg/dL (ref 74–109)
Potassium: 4 mmol/L (ref 3.5–5.0)
Sodium: 139 mmol/L (ref 132–146)

## 2016-02-25 LAB — CBC
Hematocrit: 42.3 % (ref 34.0–48.0)
Hemoglobin: 13.6 g/dL (ref 11.5–15.5)
MCH: 29.3 pg (ref 26.0–35.0)
MCHC: 32.2 % (ref 32.0–34.5)
MCV: 91.2 fL (ref 80.0–99.9)
MPV: 8.8 fL (ref 7.0–12.0)
Platelets: 349 E9/L (ref 130–450)
RBC: 4.64 E12/L (ref 3.50–5.50)
RDW: 13.6 fL (ref 11.5–15.0)
WBC: 8 E9/L (ref 4.5–11.5)

## 2016-02-25 LAB — LIPID PANEL
Cholesterol, Total: 204 mg/dL — ABNORMAL HIGH (ref 0–199)
HDL: 48 mg/dL (ref >40)
LDL Calculated: 142 mg/dL — ABNORMAL HIGH (ref 0–99)
Triglycerides: 70 mg/dL (ref 0–149)
VLDL Cholesterol Calculated: 14 mg/dL

## 2016-07-03 ENCOUNTER — Ambulatory Visit
Admit: 2016-07-03 | Discharge: 2016-07-03 | Payer: PRIVATE HEALTH INSURANCE | Attending: Family Medicine | Primary: Family Medicine

## 2016-07-03 DIAGNOSIS — M48062 Spinal stenosis, lumbar region with neurogenic claudication: Secondary | ICD-10-CM

## 2016-07-03 MED ORDER — TRAMADOL HCL 50 MG PO TABS
50 | ORAL_TABLET | Freq: Four times a day (QID) | ORAL | 2 refills | Status: DC
Start: 2016-07-03 — End: 2016-10-30

## 2016-07-03 NOTE — Progress Notes (Signed)
Andrea Pitts  DOB 22-Feb-1966    Chief Complaint:   Chief Complaint   Patient presents with   ??? 3 Month Follow-Up   ??? Finger Pain     locking up on her         Having some locking in fingers. Not often or severe. Denies severe lumbar pain with neurogenic pain in legs. Takes tramadol mg 4-6 times daily. Uses inhalers. Denies severe dyspnea or cough. Sleeps well. No change in poor vision. Had exam at [facility]. Had influenza vaccine in the office.      Andrea Pitts 50 y.o. who presents as a new patient to establish care     Past Medical History:   Diagnosis Date   ??? Asthma    ??? Hypertension    ??? 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 Obesity    ??? Pseudotumor cerebri    ??? Spinal stenosis        Past Surgical History:   Procedure Laterality Date   ??? CHOLECYSTECTOMY     ??? HYSTERECTOMY     ??? HYSTERECTOMY, TOTAL ABDOMINAL     ??? TONSILLECTOMY         Social History     Social History   ??? Marital status: Married     Spouse name: N/A   ??? Number of children: N/A   ??? Years of education: N/A     Social History Main Topics   ??? Smoking status: Never Smoker   ??? Smokeless tobacco: Never Used   ??? Alcohol use No   ??? Drug use: No   ??? Sexual activity: Yes     Partners: Male     Other Topics Concern   ??? None     Social History Narrative   ??? None        History reviewed. No pertinent family history.       Current Outpatient Prescriptions   Medication Sig Dispense Refill   ??? gabapentin (NEURONTIN) 300 MG capsule Take 2 capsules by mouth every 6 hours as needed Every 6-8 hours     ??? PROAIR HFA 108 (90 Base) MCG/ACT inhaler Take 1 Inhaler by mouth every 4 hours     ??? acetaZOLAMIDE (DIAMOX) 250 MG tablet Take 1 tablet by mouth every 6 hours     ??? traMADol (ULTRAM) 50 MG tablet Take 1 tablet by mouth every 6 hours 120 tablet 2   ??? Cyclobenzaprine HCl (FLEXERIL PO) Take by mouth     ??? Fluticasone Propionate (FLONASE NA) by Nasal route     ??? Triamterene-HCTZ (MAXZIDE-25 PO) Take 25 mg by mouth daily     ??? Omega-3 Fatty Acids (FISH OIL) 1000  MG CAPS Take 3,000 mg by mouth 3 times daily     ??? ADVAIR DISKUS 250-50 MCG/DOSE AEPB Inhale 1 puff into the lungs every 12 hours 60 each 3   ??? montelukast (SINGULAIR) 10 MG tablet Take 1 tablet by mouth nightly 90 tablet 3   ??? meloxicam (MOBIC) 15 MG tablet Take 1 tablet by mouth daily 90 tablet 1   ??? lisinopril (PRINIVIL;ZESTRIL) 10 MG tablet Take 1 tablet by mouth daily 90 tablet 1   ??? ranitidine (ZANTAC) 150 MG tablet Take 1 tablet by mouth 2 times daily 180 tablet 3   ??? Humidifiers (COOL MIST HUMIDIFIER) MISC 1 each by Does not apply route daily as needed. Use at bedtime for any sore throat, congestion, runny nose and cough relief from colds, allergies or  whatever is needed for symptom relief 1 each 0   ??? Carboxymethylcellul-Glycerin (OPTIVE SENSITIVE OP) Apply 2 drops to eye daily.     ??? therapeutic multivitamin-minerals (THERAGRAN-M) tablet Take 1 tablet by mouth daily.     ??? mometasone-formoterol (DULERA) 200-5 MCG/ACT inhaler Inhale 2 puffs into the lungs 2 times daily Rinse mouth after using. 1 Inhaler 2   ??? albuterol (PROAIR HFA) 108 (90 BASE) MCG/ACT inhaler Inhale 2 puffs into the lungs every 6 hours as needed for Wheezing 1 Inhaler 3   ??? clobetasol (TEMOVATE) 0.05 % ointment Apply topically 2 times daily. 1 Tube 1   ??? triamcinolone (KENALOG) 0.025 % cream Apply topically 2 times daily. 1 Tube 1   ??? ferrous sulfate 325 (65 FE) MG tablet Take 1 tablet by mouth daily (with breakfast). 30 tablet 3     No current facility-administered medications for this visit.        Allergies   Allergen Reactions   ??? Penicillins Rash       REVIEW OF SYSTEMS  Review of Systems   Constitutional: Negative.    Eyes: Positive for visual disturbance.   Respiratory: Negative.    Cardiovascular: Negative.    Musculoskeletal: Positive for arthralgias and back pain.       PHYSICAL EXAM  BP 112/80    Pulse 77    Ht 5\' 5"  (1.651 m)    Wt 279 lb (126.6 kg)    LMP 06/04/2009    SpO2 95%    BMI 46.43 kg/m??   Physical Exam    Constitutional: She appears well-developed and well-nourished.   HENT:   Head: Normocephalic.   Cardiovascular: Normal rate and regular rhythm.    Pulmonary/Chest: Effort normal and breath sounds normal.   Musculoskeletal: Normal range of motion.   Vitals reviewed.          Laboratory/Imaging:        ASSESSMENT/PLAN:  Philis PiqueLaticia was seen today for 3 month follow-up and finger pain.    Diagnoses and all orders for this visit:    Spinal stenosis of lumbar region with neurogenic claudication  -     traMADol (ULTRAM) 50 MG tablet; Take 1 tablet by mouth every 6 hours    Screening breast examination  -     MAM Digital Diagnostic Bilateral; Future    Screening for colorectal cancer  -     POCT Fecal Immunochemical Test (FIT); Future    Moderate persistent asthma without complication    Pseudotumor cerebri    Essential hypertension    Controlled Substances Monitoring:     Attestation: The Prescription Monitoring Report for this patient was reviewed today. Fabio Pierce(Nadiyah Zeis D Allister Lessley, DO)  Fabio PierceBruce D Nissan Frazzini, DO  07/03/2016

## 2016-08-03 ENCOUNTER — Encounter

## 2016-08-04 MED ORDER — MONTELUKAST SODIUM 10 MG PO TABS
10 MG | ORAL_TABLET | ORAL | 10 refills | Status: DC
Start: 2016-08-04 — End: 2016-10-30

## 2016-09-22 MED ORDER — DOXYCYCLINE HYCLATE 100 MG PO TABS
100 MG | ORAL_TABLET | Freq: Two times a day (BID) | ORAL | 0 refills | Status: AC
Start: 2016-09-22 — End: 2016-09-29

## 2016-09-22 NOTE — Telephone Encounter (Signed)
Patient c/o nasal  drainage, chest congestion with cough, fatigue, hoarseness that comes and goes.  Had sx since Christmas.

## 2016-10-30 ENCOUNTER — Ambulatory Visit
Admit: 2016-10-30 | Discharge: 2016-10-30 | Payer: PRIVATE HEALTH INSURANCE | Attending: Family Medicine | Primary: Family Medicine

## 2016-10-30 DIAGNOSIS — G932 Benign intracranial hypertension: Secondary | ICD-10-CM

## 2016-10-30 MED ORDER — ACETAZOLAMIDE 250 MG PO TABS
250 | ORAL_TABLET | Freq: Four times a day (QID) | ORAL | 3 refills | Status: AC
Start: 2016-10-30 — End: ?

## 2016-10-30 MED ORDER — FLUTICASONE PROPIONATE 50 MCG/ACT NA SUSP
50 MCG/ACT | Freq: Every day | NASAL | 3 refills | Status: AC
Start: 2016-10-30 — End: ?

## 2016-10-30 MED ORDER — MONTELUKAST SODIUM 10 MG PO TABS
10 | ORAL_TABLET | Freq: Every evening | ORAL | 3 refills | Status: AC
Start: 2016-10-30 — End: ?

## 2016-10-30 MED ORDER — TRAMADOL HCL 50 MG PO TABS
50 | ORAL_TABLET | Freq: Four times a day (QID) | ORAL | 2 refills | Status: AC
Start: 2016-10-30 — End: 2017-01-28

## 2016-10-30 MED ORDER — ADVAIR DISKUS 250-50 MCG/DOSE IN AEPB
250-50 | Freq: Two times a day (BID) | RESPIRATORY_TRACT | 3 refills | Status: AC
Start: 2016-10-30 — End: ?

## 2016-10-30 MED ORDER — LISINOPRIL 10 MG PO TABS
10 | ORAL_TABLET | Freq: Every day | ORAL | 3 refills | Status: AC
Start: 2016-10-30 — End: ?

## 2016-10-30 MED ORDER — ALBUTEROL SULFATE HFA 108 (90 BASE) MCG/ACT IN AERS
108 | Freq: Four times a day (QID) | RESPIRATORY_TRACT | 3 refills | Status: AC | PRN
Start: 2016-10-30 — End: 2017-01-28

## 2016-10-30 MED ORDER — GABAPENTIN 300 MG PO CAPS
300 | ORAL_CAPSULE | Freq: Three times a day (TID) | ORAL | 2 refills | Status: AC
Start: 2016-10-30 — End: 2017-01-28

## 2016-10-30 MED ORDER — TRIAMTERENE-HCTZ 37.5-25 MG PO TABS
ORAL_TABLET | Freq: Every day | ORAL | 3 refills | Status: AC
Start: 2016-10-30 — End: ?

## 2016-10-30 NOTE — Progress Notes (Signed)
Andrea Pitts  DOB 12-Dec-1965    Chief Complaint:   Chief Complaint   Patient presents with   ??? Hypertension         Seems to be doing well. Less depressed. Pleasant in the office. Moving to [place] with family soon. Chronic lumbar pain requiring tramadol four times daily. Neurontin helps radicular pain in legs. Uses a buggy when shopping. Chronic lumbar and knee pain. Weight down 10 lbs in last 6 months. No problem with sleep or asthma. Uses Advair twice daily. No worsening of vision. Needs to see Dr. Andrey Campanile.       Andrea Pitts 51 y.o. who presents as a new patient to establish care     Past Medical History:   Diagnosis Date   ??? Asthma    ??? Hypertension    ??? 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 Obesity    ??? Pseudotumor cerebri    ??? Spinal stenosis        Past Surgical History:   Procedure Laterality Date   ??? CHOLECYSTECTOMY     ??? HYSTERECTOMY     ??? HYSTERECTOMY, TOTAL ABDOMINAL     ??? TONSILLECTOMY         Social History     Social History   ??? Marital status: Married     Spouse name: N/A   ??? Number of children: N/A   ??? Years of education: N/A     Social History Main Topics   ??? Smoking status: Never Smoker   ??? Smokeless tobacco: Never Used   ??? Alcohol use No   ??? Drug use: No   ??? Sexual activity: Yes     Partners: Male     Other Topics Concern   ??? None     Social History Narrative   ??? None        History reviewed. No pertinent family history.       Current Outpatient Prescriptions   Medication Sig Dispense Refill   ??? Sennosides-Docusate Sodium (STOOL SOFTENER LAXATIVE PO) Take by mouth     ??? montelukast (SINGULAIR) 10 MG tablet Take 1 tablet by mouth nightly 90 tablet 3   ??? gabapentin (NEURONTIN) 300 MG capsule Take 2 capsules by mouth 3 times daily for 90 days Every 6-8 hours. 540 capsule 2   ??? acetaZOLAMIDE (DIAMOX) 250 MG tablet Take 1 tablet by mouth every 6 hours 360 tablet 3   ??? traMADol (ULTRAM) 50 MG tablet Take 1 tablet by mouth every 6 hours for 90 days. 360 tablet 2   ??? lisinopril  (PRINIVIL;ZESTRIL) 10 MG tablet Take 1 tablet by mouth daily 90 tablet 3   ??? ADVAIR DISKUS 250-50 MCG/DOSE AEPB Inhale 1 puff into the lungs every 12 hours 3 each 3   ??? albuterol sulfate HFA (PROAIR HFA) 108 (90 Base) MCG/ACT inhaler Inhale 2 puffs into the lungs every 6 hours as needed for Wheezing 3 Inhaler 3   ??? triamterene-hydrochlorothiazide (MAXZIDE-25) 37.5-25 MG per tablet Take 1 tablet by mouth daily 90 tablet 3   ??? fluticasone (FLONASE) 50 MCG/ACT nasal spray 1 spray by Nasal route daily 3 Bottle 3   ??? Fluticasone Propionate (FLONASE NA) by Nasal route     ??? Triamterene-HCTZ (MAXZIDE-25 PO) Take 25 mg by mouth daily     ??? Omega-3 Fatty Acids (FISH OIL) 1000 MG CAPS Take 3,000 mg by mouth 3 times daily     ??? ranitidine (ZANTAC) 150 MG tablet Take 1 tablet  by mouth 2 times daily 180 tablet 3   ??? Carboxymethylcellul-Glycerin (OPTIVE SENSITIVE OP) Apply 2 drops to eye daily.     ??? therapeutic multivitamin-minerals (THERAGRAN-M) tablet Take 1 tablet by mouth daily.       No current facility-administered medications for this visit.        Allergies   Allergen Reactions   ??? Penicillins Rash       REVIEW OF SYSTEMS  Review of Systems   Constitutional: Negative.    HENT: Negative.    Eyes: Positive for visual disturbance.   Respiratory: Negative.    Musculoskeletal: Positive for back pain and gait problem.   Neurological: Positive for numbness.   Psychiatric/Behavioral: Negative for dysphoric mood.       PHYSICAL EXAM  BP 108/80    Pulse 65    Wt 273 lb (123.8 kg)    LMP 06/04/2009    SpO2 98%    BMI 45.43 kg/m??   Physical Exam   Constitutional: She appears well-developed and well-nourished.   HENT:   Head: Normocephalic.   Cardiovascular: Normal rate and regular rhythm.    Pulmonary/Chest: Effort normal and breath sounds normal.   Musculoskeletal: Normal range of motion.   Vitals reviewed.              ASSESSMENT/PLAN:  Andrea Pitts was seen today for hypertension.    Diagnoses and all orders for this  visit:    Pseudotumor cerebri  -     [facility] Nehemiah MassedWilson, Keith, MD ([facility])  -     acetaZOLAMIDE (DIAMOX) 250 MG tablet; Take 1 tablet by mouth every 6 hours    Spinal stenosis of lumbar region with neurogenic claudication  -     gabapentin (NEURONTIN) 300 MG capsule; Take 2 capsules by mouth 3 times daily for 90 days Every 6-8 hours.  -     traMADol (ULTRAM) 50 MG tablet; Take 1 tablet by mouth every 6 hours for 90 days.    Morbid obesity (HCC)    Moderate persistent asthma without complication  -     montelukast (SINGULAIR) 10 MG tablet; Take 1 tablet by mouth nightly  -     ADVAIR DISKUS 250-50 MCG/DOSE AEPB; Inhale 1 puff into the lungs every 12 hours  -     albuterol sulfate HFA (PROAIR HFA) 108 (90 Base) MCG/ACT inhaler; Inhale 2 puffs into the lungs every 6 hours as needed for Wheezing  -     fluticasone (FLONASE) 50 MCG/ACT nasal spray; 1 spray by Nasal route daily    Essential hypertension  -     Basic Metabolic Panel; Future  -     CBC; Future  -     Lipid Panel; Future  -     lisinopril (PRINIVIL;ZESTRIL) 10 MG tablet; Take 1 tablet by mouth daily  -     triamterene-hydrochlorothiazide (MAXZIDE-25) 37.5-25 MG per tablet; Take 1 tablet by mouth daily    Elevated lipids    Colon cancer screening  -     POCT Fecal Immunochemical Test (FIT); Future    Breast cancer screening  -     MAM DIGITAL SCREEN W OR WO CAD BILATERAL; Future    Reactive depression    Mild intermittent asthma without complication  -     montelukast (SINGULAIR) 10 MG tablet; Take 1 tablet by mouth nightly    Controlled Substances Monitoring:     The Prescription Monitoring Report for this patient was reviewed today. (  Fabio Pierce, DO)    No signs of potential drug abuse or diversion identified., Obtaining appropriate analgesic effect of treatment. Fabio Pierce, DO)              Medication contract signed today. Fabio Pierce, DO)      Fabio Pierce, DO  10/30/2016

## 2017-04-30 ENCOUNTER — Encounter: Attending: Family Medicine | Primary: Family Medicine

## 2017-05-14 DIAGNOSIS — I10 Essential (primary) hypertension: Secondary | ICD-10-CM | POA: Diagnosis not present

## 2017-05-14 DIAGNOSIS — H547 Unspecified visual loss: Secondary | ICD-10-CM | POA: Diagnosis not present

## 2017-05-14 DIAGNOSIS — J4540 Moderate persistent asthma, uncomplicated: Secondary | ICD-10-CM | POA: Diagnosis not present

## 2017-05-14 DIAGNOSIS — R69 Illness, unspecified: Secondary | ICD-10-CM | POA: Diagnosis not present

## 2017-06-06 DIAGNOSIS — I10 Essential (primary) hypertension: Secondary | ICD-10-CM | POA: Diagnosis not present

## 2017-06-06 DIAGNOSIS — R69 Illness, unspecified: Secondary | ICD-10-CM | POA: Diagnosis not present

## 2017-06-06 DIAGNOSIS — J4540 Moderate persistent asthma, uncomplicated: Secondary | ICD-10-CM | POA: Diagnosis not present

## 2017-06-13 DIAGNOSIS — Z23 Encounter for immunization: Secondary | ICD-10-CM | POA: Diagnosis not present

## 2017-06-13 DIAGNOSIS — R5383 Other fatigue: Secondary | ICD-10-CM | POA: Diagnosis not present

## 2017-06-13 DIAGNOSIS — R69 Illness, unspecified: Secondary | ICD-10-CM | POA: Diagnosis not present

## 2017-06-13 DIAGNOSIS — I10 Essential (primary) hypertension: Secondary | ICD-10-CM | POA: Diagnosis not present

## 2017-06-13 DIAGNOSIS — E782 Mixed hyperlipidemia: Secondary | ICD-10-CM | POA: Diagnosis not present

## 2017-09-19 DIAGNOSIS — Z0000 Encounter for general adult medical examination without abnormal findings: Secondary | ICD-10-CM | POA: Diagnosis not present

## 2017-09-19 DIAGNOSIS — R5383 Other fatigue: Secondary | ICD-10-CM | POA: Diagnosis not present

## 2017-09-19 DIAGNOSIS — E782 Mixed hyperlipidemia: Secondary | ICD-10-CM | POA: Diagnosis not present

## 2017-09-26 DIAGNOSIS — R69 Illness, unspecified: Secondary | ICD-10-CM | POA: Diagnosis not present

## 2017-09-26 DIAGNOSIS — M4317 Spondylolisthesis, lumbosacral region: Secondary | ICD-10-CM | POA: Diagnosis not present

## 2017-09-26 DIAGNOSIS — I10 Essential (primary) hypertension: Secondary | ICD-10-CM | POA: Diagnosis not present

## 2017-09-26 DIAGNOSIS — J4540 Moderate persistent asthma, uncomplicated: Secondary | ICD-10-CM | POA: Diagnosis not present

## 2017-09-26 DIAGNOSIS — E782 Mixed hyperlipidemia: Secondary | ICD-10-CM | POA: Diagnosis not present

## 2017-10-18 DIAGNOSIS — K219 Gastro-esophageal reflux disease without esophagitis: Secondary | ICD-10-CM | POA: Diagnosis not present

## 2017-10-18 DIAGNOSIS — G932 Benign intracranial hypertension: Secondary | ICD-10-CM | POA: Diagnosis not present

## 2017-10-18 DIAGNOSIS — I10 Essential (primary) hypertension: Secondary | ICD-10-CM | POA: Diagnosis not present

## 2017-10-18 DIAGNOSIS — I440 Atrioventricular block, first degree: Secondary | ICD-10-CM | POA: Diagnosis not present

## 2017-11-05 DIAGNOSIS — I10 Essential (primary) hypertension: Secondary | ICD-10-CM | POA: Diagnosis not present

## 2017-11-05 DIAGNOSIS — K219 Gastro-esophageal reflux disease without esophagitis: Secondary | ICD-10-CM | POA: Diagnosis not present

## 2017-11-05 DIAGNOSIS — R140 Abdominal distension (gaseous): Secondary | ICD-10-CM | POA: Diagnosis not present

## 2017-11-05 DIAGNOSIS — Z6841 Body Mass Index (BMI) 40.0 and over, adult: Secondary | ICD-10-CM | POA: Diagnosis not present

## 2017-11-19 DIAGNOSIS — I10 Essential (primary) hypertension: Secondary | ICD-10-CM | POA: Diagnosis not present

## 2017-11-19 DIAGNOSIS — G932 Benign intracranial hypertension: Secondary | ICD-10-CM | POA: Diagnosis not present

## 2017-11-19 DIAGNOSIS — H547 Unspecified visual loss: Secondary | ICD-10-CM | POA: Diagnosis not present

## 2017-11-19 DIAGNOSIS — K219 Gastro-esophageal reflux disease without esophagitis: Secondary | ICD-10-CM | POA: Diagnosis not present

## 2017-11-19 DIAGNOSIS — Z6841 Body Mass Index (BMI) 40.0 and over, adult: Secondary | ICD-10-CM | POA: Diagnosis not present

## 2017-11-19 DIAGNOSIS — R9431 Abnormal electrocardiogram [ECG] [EKG]: Secondary | ICD-10-CM | POA: Diagnosis not present

## 2017-11-29 DIAGNOSIS — R9431 Abnormal electrocardiogram [ECG] [EKG]: Secondary | ICD-10-CM | POA: Diagnosis not present

## 2017-12-12 DIAGNOSIS — E782 Mixed hyperlipidemia: Secondary | ICD-10-CM | POA: Diagnosis not present

## 2017-12-18 DIAGNOSIS — Z1212 Encounter for screening for malignant neoplasm of rectum: Secondary | ICD-10-CM | POA: Diagnosis not present

## 2017-12-18 DIAGNOSIS — Z1211 Encounter for screening for malignant neoplasm of colon: Secondary | ICD-10-CM | POA: Diagnosis not present

## 2017-12-19 DIAGNOSIS — R69 Illness, unspecified: Secondary | ICD-10-CM | POA: Diagnosis not present

## 2017-12-19 DIAGNOSIS — J4540 Moderate persistent asthma, uncomplicated: Secondary | ICD-10-CM | POA: Diagnosis not present

## 2017-12-19 DIAGNOSIS — I10 Essential (primary) hypertension: Secondary | ICD-10-CM | POA: Diagnosis not present

## 2017-12-19 DIAGNOSIS — E782 Mixed hyperlipidemia: Secondary | ICD-10-CM | POA: Diagnosis not present

## 2017-12-20 DIAGNOSIS — Z6841 Body Mass Index (BMI) 40.0 and over, adult: Secondary | ICD-10-CM | POA: Diagnosis not present

## 2017-12-20 DIAGNOSIS — I10 Essential (primary) hypertension: Secondary | ICD-10-CM | POA: Diagnosis not present

## 2017-12-20 DIAGNOSIS — G932 Benign intracranial hypertension: Secondary | ICD-10-CM | POA: Diagnosis not present

## 2017-12-20 DIAGNOSIS — K219 Gastro-esophageal reflux disease without esophagitis: Secondary | ICD-10-CM | POA: Diagnosis not present

## 2017-12-25 DIAGNOSIS — R05 Cough: Secondary | ICD-10-CM | POA: Diagnosis not present

## 2017-12-25 DIAGNOSIS — Z713 Dietary counseling and surveillance: Secondary | ICD-10-CM | POA: Diagnosis not present

## 2017-12-25 DIAGNOSIS — Z6841 Body Mass Index (BMI) 40.0 and over, adult: Secondary | ICD-10-CM | POA: Diagnosis not present

## 2017-12-25 DIAGNOSIS — H547 Unspecified visual loss: Secondary | ICD-10-CM | POA: Diagnosis not present

## 2017-12-25 DIAGNOSIS — J4540 Moderate persistent asthma, uncomplicated: Secondary | ICD-10-CM | POA: Diagnosis not present

## 2017-12-25 DIAGNOSIS — J22 Unspecified acute lower respiratory infection: Secondary | ICD-10-CM | POA: Diagnosis not present

## 2017-12-25 DIAGNOSIS — R062 Wheezing: Secondary | ICD-10-CM | POA: Diagnosis not present

## 2018-01-07 DIAGNOSIS — R69 Illness, unspecified: Secondary | ICD-10-CM | POA: Diagnosis not present

## 2018-01-07 DIAGNOSIS — R05 Cough: Secondary | ICD-10-CM | POA: Diagnosis not present

## 2018-01-07 DIAGNOSIS — F419 Anxiety disorder, unspecified: Secondary | ICD-10-CM | POA: Diagnosis not present

## 2018-01-07 DIAGNOSIS — J22 Unspecified acute lower respiratory infection: Secondary | ICD-10-CM | POA: Diagnosis not present

## 2018-01-07 DIAGNOSIS — R0602 Shortness of breath: Secondary | ICD-10-CM | POA: Diagnosis not present

## 2018-01-07 DIAGNOSIS — J0110 Acute frontal sinusitis, unspecified: Secondary | ICD-10-CM | POA: Diagnosis not present

## 2018-01-14 DIAGNOSIS — J069 Acute upper respiratory infection, unspecified: Secondary | ICD-10-CM | POA: Diagnosis not present

## 2018-01-14 DIAGNOSIS — J209 Acute bronchitis, unspecified: Secondary | ICD-10-CM | POA: Diagnosis not present

## 2018-01-23 DIAGNOSIS — I10 Essential (primary) hypertension: Secondary | ICD-10-CM | POA: Diagnosis not present

## 2018-01-23 DIAGNOSIS — Z713 Dietary counseling and surveillance: Secondary | ICD-10-CM | POA: Diagnosis not present

## 2018-01-23 DIAGNOSIS — H548 Legal blindness, as defined in USA: Secondary | ICD-10-CM | POA: Diagnosis not present

## 2018-01-23 DIAGNOSIS — Z6841 Body Mass Index (BMI) 40.0 and over, adult: Secondary | ICD-10-CM | POA: Diagnosis not present

## 2018-01-30 DIAGNOSIS — Z6841 Body Mass Index (BMI) 40.0 and over, adult: Secondary | ICD-10-CM | POA: Diagnosis not present

## 2018-02-07 DIAGNOSIS — F419 Anxiety disorder, unspecified: Secondary | ICD-10-CM | POA: Diagnosis not present

## 2018-02-07 DIAGNOSIS — R69 Illness, unspecified: Secondary | ICD-10-CM | POA: Diagnosis not present

## 2018-02-25 DIAGNOSIS — G932 Benign intracranial hypertension: Secondary | ICD-10-CM | POA: Diagnosis not present

## 2018-02-25 DIAGNOSIS — E669 Obesity, unspecified: Secondary | ICD-10-CM | POA: Diagnosis not present

## 2018-02-25 DIAGNOSIS — Z713 Dietary counseling and surveillance: Secondary | ICD-10-CM | POA: Diagnosis not present

## 2018-02-25 DIAGNOSIS — I10 Essential (primary) hypertension: Secondary | ICD-10-CM | POA: Diagnosis not present

## 2018-02-25 DIAGNOSIS — Z6839 Body mass index (BMI) 39.0-39.9, adult: Secondary | ICD-10-CM | POA: Diagnosis not present

## 2018-03-28 DIAGNOSIS — F419 Anxiety disorder, unspecified: Secondary | ICD-10-CM | POA: Diagnosis not present

## 2018-03-28 DIAGNOSIS — Z6839 Body mass index (BMI) 39.0-39.9, adult: Secondary | ICD-10-CM | POA: Diagnosis not present

## 2018-03-28 DIAGNOSIS — R69 Illness, unspecified: Secondary | ICD-10-CM | POA: Diagnosis not present

## 2018-03-28 DIAGNOSIS — I10 Essential (primary) hypertension: Secondary | ICD-10-CM | POA: Diagnosis not present

## 2018-04-22 DIAGNOSIS — G932 Benign intracranial hypertension: Secondary | ICD-10-CM | POA: Diagnosis not present

## 2018-04-22 DIAGNOSIS — Z713 Dietary counseling and surveillance: Secondary | ICD-10-CM | POA: Diagnosis not present

## 2018-04-22 DIAGNOSIS — K219 Gastro-esophageal reflux disease without esophagitis: Secondary | ICD-10-CM | POA: Diagnosis not present

## 2018-04-22 DIAGNOSIS — R69 Illness, unspecified: Secondary | ICD-10-CM | POA: Diagnosis not present

## 2018-04-22 DIAGNOSIS — Z6839 Body mass index (BMI) 39.0-39.9, adult: Secondary | ICD-10-CM | POA: Diagnosis not present

## 2018-04-22 DIAGNOSIS — E669 Obesity, unspecified: Secondary | ICD-10-CM | POA: Diagnosis not present

## 2018-04-22 DIAGNOSIS — I10 Essential (primary) hypertension: Secondary | ICD-10-CM | POA: Diagnosis not present

## 2018-05-27 DIAGNOSIS — E669 Obesity, unspecified: Secondary | ICD-10-CM | POA: Diagnosis not present

## 2018-05-27 DIAGNOSIS — Z713 Dietary counseling and surveillance: Secondary | ICD-10-CM | POA: Diagnosis not present

## 2018-05-27 DIAGNOSIS — Z6838 Body mass index (BMI) 38.0-38.9, adult: Secondary | ICD-10-CM | POA: Diagnosis not present

## 2018-05-27 DIAGNOSIS — I10 Essential (primary) hypertension: Secondary | ICD-10-CM | POA: Diagnosis not present

## 2018-05-27 DIAGNOSIS — G932 Benign intracranial hypertension: Secondary | ICD-10-CM | POA: Diagnosis not present

## 2018-05-27 DIAGNOSIS — Z01818 Encounter for other preprocedural examination: Secondary | ICD-10-CM | POA: Diagnosis not present

## 2018-06-24 DIAGNOSIS — Z6839 Body mass index (BMI) 39.0-39.9, adult: Secondary | ICD-10-CM | POA: Diagnosis not present

## 2018-06-24 DIAGNOSIS — E669 Obesity, unspecified: Secondary | ICD-10-CM | POA: Diagnosis not present

## 2018-06-24 DIAGNOSIS — Z713 Dietary counseling and surveillance: Secondary | ICD-10-CM | POA: Diagnosis not present

## 2018-06-25 DIAGNOSIS — Z01818 Encounter for other preprocedural examination: Secondary | ICD-10-CM | POA: Diagnosis not present

## 2018-07-04 DIAGNOSIS — Z6835 Body mass index (BMI) 35.0-35.9, adult: Secondary | ICD-10-CM | POA: Diagnosis not present

## 2018-07-04 DIAGNOSIS — Z91040 Latex allergy status: Secondary | ICD-10-CM | POA: Diagnosis not present

## 2018-07-04 DIAGNOSIS — Z880 Allergy status to penicillin: Secondary | ICD-10-CM | POA: Diagnosis not present

## 2018-07-04 DIAGNOSIS — Z01818 Encounter for other preprocedural examination: Secondary | ICD-10-CM | POA: Diagnosis not present

## 2018-07-08 DIAGNOSIS — Z6841 Body Mass Index (BMI) 40.0 and over, adult: Secondary | ICD-10-CM | POA: Diagnosis not present

## 2018-07-08 DIAGNOSIS — E785 Hyperlipidemia, unspecified: Secondary | ICD-10-CM | POA: Diagnosis not present

## 2018-07-08 DIAGNOSIS — I10 Essential (primary) hypertension: Secondary | ICD-10-CM | POA: Diagnosis not present

## 2018-07-08 DIAGNOSIS — K219 Gastro-esophageal reflux disease without esophagitis: Secondary | ICD-10-CM | POA: Diagnosis not present

## 2018-07-08 DIAGNOSIS — J309 Allergic rhinitis, unspecified: Secondary | ICD-10-CM | POA: Diagnosis not present

## 2018-07-08 DIAGNOSIS — K2950 Unspecified chronic gastritis without bleeding: Secondary | ICD-10-CM | POA: Diagnosis not present

## 2018-07-08 DIAGNOSIS — B9681 Helicobacter pylori [H. pylori] as the cause of diseases classified elsewhere: Secondary | ICD-10-CM | POA: Diagnosis not present

## 2018-07-08 DIAGNOSIS — G932 Benign intracranial hypertension: Secondary | ICD-10-CM | POA: Diagnosis not present

## 2018-07-08 DIAGNOSIS — R69 Illness, unspecified: Secondary | ICD-10-CM | POA: Diagnosis not present

## 2018-07-08 DIAGNOSIS — J449 Chronic obstructive pulmonary disease, unspecified: Secondary | ICD-10-CM | POA: Diagnosis not present

## 2018-08-07 DIAGNOSIS — E669 Obesity, unspecified: Secondary | ICD-10-CM | POA: Diagnosis not present

## 2018-08-07 DIAGNOSIS — Z6834 Body mass index (BMI) 34.0-34.9, adult: Secondary | ICD-10-CM | POA: Diagnosis not present

## 2018-08-07 DIAGNOSIS — Z903 Acquired absence of stomach [part of]: Secondary | ICD-10-CM | POA: Diagnosis not present

## 2018-08-07 DIAGNOSIS — Z713 Dietary counseling and surveillance: Secondary | ICD-10-CM | POA: Diagnosis not present

## 2018-08-09 DIAGNOSIS — Z9884 Bariatric surgery status: Secondary | ICD-10-CM | POA: Diagnosis not present

## 2018-09-03 ENCOUNTER — Emergency Department (HOSPITAL_COMMUNITY): Payer: Medicare HMO

## 2018-09-03 ENCOUNTER — Encounter (HOSPITAL_COMMUNITY): Payer: Self-pay | Admitting: Emergency Medicine

## 2018-09-03 ENCOUNTER — Emergency Department (HOSPITAL_COMMUNITY)
Admission: EM | Admit: 2018-09-03 | Discharge: 2018-09-03 | Disposition: A | Discharge status: Home / Self Care | Payer: Medicare HMO | Attending: Emergency Medicine | Admitting: Emergency Medicine

## 2018-09-03 ENCOUNTER — Other Ambulatory Visit: Payer: Self-pay

## 2018-09-03 DIAGNOSIS — M79671 Pain in right foot: Secondary | ICD-10-CM | POA: Diagnosis not present

## 2018-09-03 DIAGNOSIS — M7989 Other specified soft tissue disorders: Secondary | ICD-10-CM | POA: Diagnosis not present

## 2018-09-03 DIAGNOSIS — M25571 Pain in right ankle and joints of right foot: Secondary | ICD-10-CM | POA: Diagnosis not present

## 2018-09-03 HISTORY — DX: Unspecified asthma, uncomplicated: J45.909

## 2018-09-03 HISTORY — DX: Unspecified visual loss: H54.7

## 2018-09-03 HISTORY — DX: Essential (primary) hypertension: I10

## 2018-09-03 NOTE — ED Provider Notes (Signed)
[facility] EMERGENCY DEPARTMENT Provider Note   CSN: [id number] Arrival date & time: 09/03/18  1408   History   Chief Complaint Chief Complaint  Patient presents with  . Foot Pain    HPI Sandra Chambers is a 52 y.o. female with past medical history significant for hypertension, asthma, visual impairment who presents for evaluation of right foot pain.  Pain started on Sunday.  Pain is located over the dorsal ventral surface of the right midfoot.  Patient states sometimes her pain radiates up to her medial malleolus.  Has been taking Tylenol intermittently for pain.  Denies recent injuries or trauma.  Family that is present with patient states they have not noticed any warmth, redness or swelling to the extremity.  Denies fever, chills, nausea, vomiting, chest pain, shortness of breath, abdominal pain, numbness or tingling in her extremities, decreased range of motion.  Patient states pain is worse when she wakes up in the morning and begins to ambulate.  Rates her pain a 6/10.  Describes it as an aching sensation.  Denies recent insect bites, history of gout, IV drug use.  She states she has been able to ambulate, however it is painful.  History obtained from patient and family.  No interpreter was used.  HPI  Past Medical History:  Diagnosis Date  . Asthma   . Blind   . Hypertension     There are no active problems to display for this patient.   History reviewed. No pertinent surgical history.   OB History   No obstetric history on file.      Home Medications    Prior to Admission medications   Not on File    Family History No family history on file.  Social History Social History   Tobacco Use  . Smoking status: Never Smoker  . Smokeless tobacco: Never Used  Substance Use Topics  . Alcohol use: Not Currently  . Drug use: Not Currently     Allergies   Patient has no allergy information on record.   Review of Systems Review of  Systems  Constitutional: Negative.   HENT: Negative.   Respiratory: Negative.   Cardiovascular: Negative.   Gastrointestinal: Negative.   Genitourinary: Negative.   Musculoskeletal: Negative for back pain, gait problem, neck pain and neck stiffness.       Right foot pain.  Skin: Negative.   Neurological: Negative.   All other systems reviewed and are negative.    Physical Exam Updated Vital Signs BP 118/85 (BP Location: Left Arm)   Pulse 72   Temp 99 F (37.2 C) (Oral)   Resp 17   Ht 5\' 5"  (1.651 m)   Wt 90.3 kg   SpO2 98%   BMI 33.12 kg/m   Physical Exam Vitals signs and nursing note reviewed.  Constitutional:      General: She is not in acute distress.    Appearance: She is well-developed. She is not ill-appearing or toxic-appearing.  HENT:     Head: Atraumatic.  Eyes:     Pupils: Pupils are equal, round, and reactive to light.  Neck:     Musculoskeletal: Normal range of motion.  Cardiovascular:     Rate and Rhythm: Normal rate.     Pulses: Normal pulses.     Heart sounds: Normal heart sounds. No murmur. No friction rub. No gallop.   Pulmonary:     Effort: Pulmonary effort is normal. No respiratory distress.     Breath sounds: Normal  breath sounds. No stridor. No wheezing, rhonchi or rales.  Abdominal:     General: Bowel sounds are normal. There is no distension.     Tenderness: There is no abdominal tenderness. There is no guarding or rebound.  Musculoskeletal: Normal range of motion.     Comments: Tenderness palpation over dorsal and ventral surface of right midfoot.  No tenderness over navicular or calcaneus.  Mild tenderness over medial malleolus with no tenderness over lateral malleolus.  Full range of motion bilateral lower extremities with plantarflexion, dorsiflexion, inversion and eversion.5/5 strength to bilateral lower extremities.  Lower extremity compartments soft.  No tenderness, warmth, edema to calves.  Skin:    General: Skin is warm and dry.      Comments: No edema, erythema, ecchymosis or warmth.  No rashes or lesions.  Neurological:     Mental Status: She is alert.     Comments: Intact sensation to sharp and dull bilateral lower extremities.      ED Treatments / Results  Labs (all labs ordered are listed, but only abnormal results are displayed) Labs Reviewed - No data to display  EKG None  Radiology Dg Ankle Complete Right  Result Date: 09/03/2018 CLINICAL DATA:  Pain. No injury. EXAM: RIGHT ANKLE - COMPLETE 3+ VIEW COMPARISON:  None. FINDINGS: There is no evidence of fracture, dislocation, or joint effusion. There is no evidence of arthropathy or other focal bone abnormality. Pes planus. Soft tissues are unremarkable. IMPRESSION: No acute findings. Electronically Signed   By: Elsie StainJohn T Curnes M.D.   On: 09/03/2018 16:30   Dg Foot Complete Right  Result Date: 09/03/2018 CLINICAL DATA:  52 year old female with a history of pain at the top of the right foot with swelling. EXAM: RIGHT FOOT COMPLETE - 3+ VIEW COMPARISON:  None. FINDINGS: No acute displaced fracture. No focal soft tissue swelling. No radiopaque foreign body. No subluxation/dislocation. Mild degenerative changes of the interphalangeal joints. Degenerative changes at the hindfoot. No joint effusion. IMPRESSION: Negative for acute bony abnormality. Electronically Signed   By: Gilmer MorJaime  Wagner D.O.   On: 09/03/2018 16:27    Procedures Procedures (including critical care time)  Medications Ordered in ED Medications - No data to display   Initial Impression / Assessment and Plan / ED Course  I have reviewed the triage vital signs and the nursing notes.  Pertinent labs & imaging results that were available during my care of the patient were reviewed by me and considered in my medical decision making (see chart for details).  52 year old female who appears otherwise well presents for evaluation of right foot pain.  Onset on Sunday.  Afebrile, nonseptic,  non-ill-appearing.  No known history of trauma.  Tenderness located over dorsal ventral surface of right midfoot as well as medial malleolus.  Normal musculoskeletal exam.  Neurovascularly intact.  No IV drug use,  Recent insect bites.  Skin without edema, erythema, ecchymosis or warmth.  No evidence of infectious pathology.  Patient able to ambulate, however has pain with ambulation.  Lower extremity compartments are soft.  Low suspicion for septic joint, gout, hemarthrosis, DVT.  Will obtain plain film and reevaluate.  Plain film negative for fracture or dislocation.  No evidence of soft tissue swelling.  No joint effusion.  Patient likely musculoskeletal strain or sprain.  Patient did recently have bypass surgery.  Cannot take NSAIDs.  RICE for symptomatic management.  Will place an ASO brace and have patient follow-up with PCP for reevaluation if she has continued pain.  Patient  able to ambulate in department.  Also discussed follow-up with orthopedics.  Low suspicion for emergent pathology causing patient's symptoms at this time.  Discussed return precautions.  Patient and family voiced understanding and is agreeable for follow-up.    Final Clinical Impressions(s) / ED Diagnoses   Final diagnoses:  Foot pain, right    ED Discharge Orders    None       Henderly, Britni A, PA-C 09/03/18 1726    Rolan BuccoBelfi, Melanie, MD 09/03/18 1839

## 2018-09-03 NOTE — ED Notes (Signed)
Pt stable, ambulatory, states understanding of discharge instructions 

## 2018-09-03 NOTE — ED Triage Notes (Signed)
Pt. Stated, Sandra Chambers got rt. Foot pain , no injury. Started on Sunday.

## 2018-09-03 NOTE — Discharge Instructions (Addendum)
You were evaluated today for right foot pain.  Your x-rays were negative.  This area does not look acutely infected. I have placed you in an ankle splint.  You may also ice, elevate your lower extremity.  Please follow-up with your PCP for reevaluation.  Return to the ED for any new or worsening symptoms.

## 2018-09-27 DIAGNOSIS — Z23 Encounter for immunization: Secondary | ICD-10-CM | POA: Diagnosis not present

## 2018-10-07 DIAGNOSIS — F4329 Adjustment disorder with other symptoms: Secondary | ICD-10-CM | POA: Diagnosis not present

## 2018-10-21 DIAGNOSIS — E782 Mixed hyperlipidemia: Secondary | ICD-10-CM | POA: Diagnosis not present

## 2018-10-21 DIAGNOSIS — J4540 Moderate persistent asthma, uncomplicated: Secondary | ICD-10-CM | POA: Diagnosis not present

## 2018-10-21 DIAGNOSIS — H547 Unspecified visual loss: Secondary | ICD-10-CM | POA: Diagnosis not present

## 2018-10-21 DIAGNOSIS — I10 Essential (primary) hypertension: Secondary | ICD-10-CM | POA: Diagnosis not present

## 2018-10-21 DIAGNOSIS — Z0000 Encounter for general adult medical examination without abnormal findings: Secondary | ICD-10-CM | POA: Diagnosis not present

## 2018-10-21 DIAGNOSIS — R5383 Other fatigue: Secondary | ICD-10-CM | POA: Diagnosis not present

## 2018-10-21 DIAGNOSIS — F322 Major depressive disorder, single episode, severe without psychotic features: Secondary | ICD-10-CM | POA: Diagnosis not present

## 2018-10-28 DIAGNOSIS — M4317 Spondylolisthesis, lumbosacral region: Secondary | ICD-10-CM | POA: Diagnosis not present

## 2018-10-28 DIAGNOSIS — R5383 Other fatigue: Secondary | ICD-10-CM | POA: Diagnosis not present

## 2018-10-28 DIAGNOSIS — H547 Unspecified visual loss: Secondary | ICD-10-CM | POA: Diagnosis not present

## 2018-10-28 DIAGNOSIS — Z0000 Encounter for general adult medical examination without abnormal findings: Secondary | ICD-10-CM | POA: Diagnosis not present

## 2018-10-28 DIAGNOSIS — E782 Mixed hyperlipidemia: Secondary | ICD-10-CM | POA: Diagnosis not present

## 2018-10-28 DIAGNOSIS — I10 Essential (primary) hypertension: Secondary | ICD-10-CM | POA: Diagnosis not present

## 2018-10-28 DIAGNOSIS — F322 Major depressive disorder, single episode, severe without psychotic features: Secondary | ICD-10-CM | POA: Diagnosis not present

## 2018-10-28 DIAGNOSIS — J4540 Moderate persistent asthma, uncomplicated: Secondary | ICD-10-CM | POA: Diagnosis not present

## 2019-03-10 DIAGNOSIS — Z713 Dietary counseling and surveillance: Secondary | ICD-10-CM | POA: Diagnosis not present

## 2019-03-10 DIAGNOSIS — Z6830 Body mass index (BMI) 30.0-30.9, adult: Secondary | ICD-10-CM | POA: Diagnosis not present

## 2019-03-10 DIAGNOSIS — E669 Obesity, unspecified: Secondary | ICD-10-CM | POA: Diagnosis not present

## 2019-03-10 DIAGNOSIS — Z903 Acquired absence of stomach [part of]: Secondary | ICD-10-CM | POA: Diagnosis not present

## 2019-04-21 DIAGNOSIS — Z903 Acquired absence of stomach [part of]: Secondary | ICD-10-CM | POA: Diagnosis not present

## 2019-04-21 DIAGNOSIS — Z713 Dietary counseling and surveillance: Secondary | ICD-10-CM | POA: Diagnosis not present

## 2019-05-26 DIAGNOSIS — E782 Mixed hyperlipidemia: Secondary | ICD-10-CM | POA: Diagnosis not present

## 2019-09-29 DIAGNOSIS — E782 Mixed hyperlipidemia: Secondary | ICD-10-CM | POA: Diagnosis not present

## 2019-10-06 DIAGNOSIS — H547 Unspecified visual loss: Secondary | ICD-10-CM | POA: Diagnosis not present

## 2019-10-06 DIAGNOSIS — I10 Essential (primary) hypertension: Secondary | ICD-10-CM | POA: Diagnosis not present

## 2019-10-06 DIAGNOSIS — E782 Mixed hyperlipidemia: Secondary | ICD-10-CM | POA: Diagnosis not present

## 2019-10-06 DIAGNOSIS — R5383 Other fatigue: Secondary | ICD-10-CM | POA: Diagnosis not present

## 2019-10-06 DIAGNOSIS — F322 Major depressive disorder, single episode, severe without psychotic features: Secondary | ICD-10-CM | POA: Diagnosis not present

## 2019-10-06 DIAGNOSIS — J4540 Moderate persistent asthma, uncomplicated: Secondary | ICD-10-CM | POA: Diagnosis not present

## 2020-02-10 IMAGING — DX DG ANKLE COMPLETE 3+V*R*
3 series · 3 of 3 positions shown · non-contrast
Comparison: None.

CLINICAL DATA: Pain. No injury.

EXAM:
RIGHT ANKLE - COMPLETE 3+ VIEW

[ankle ap]
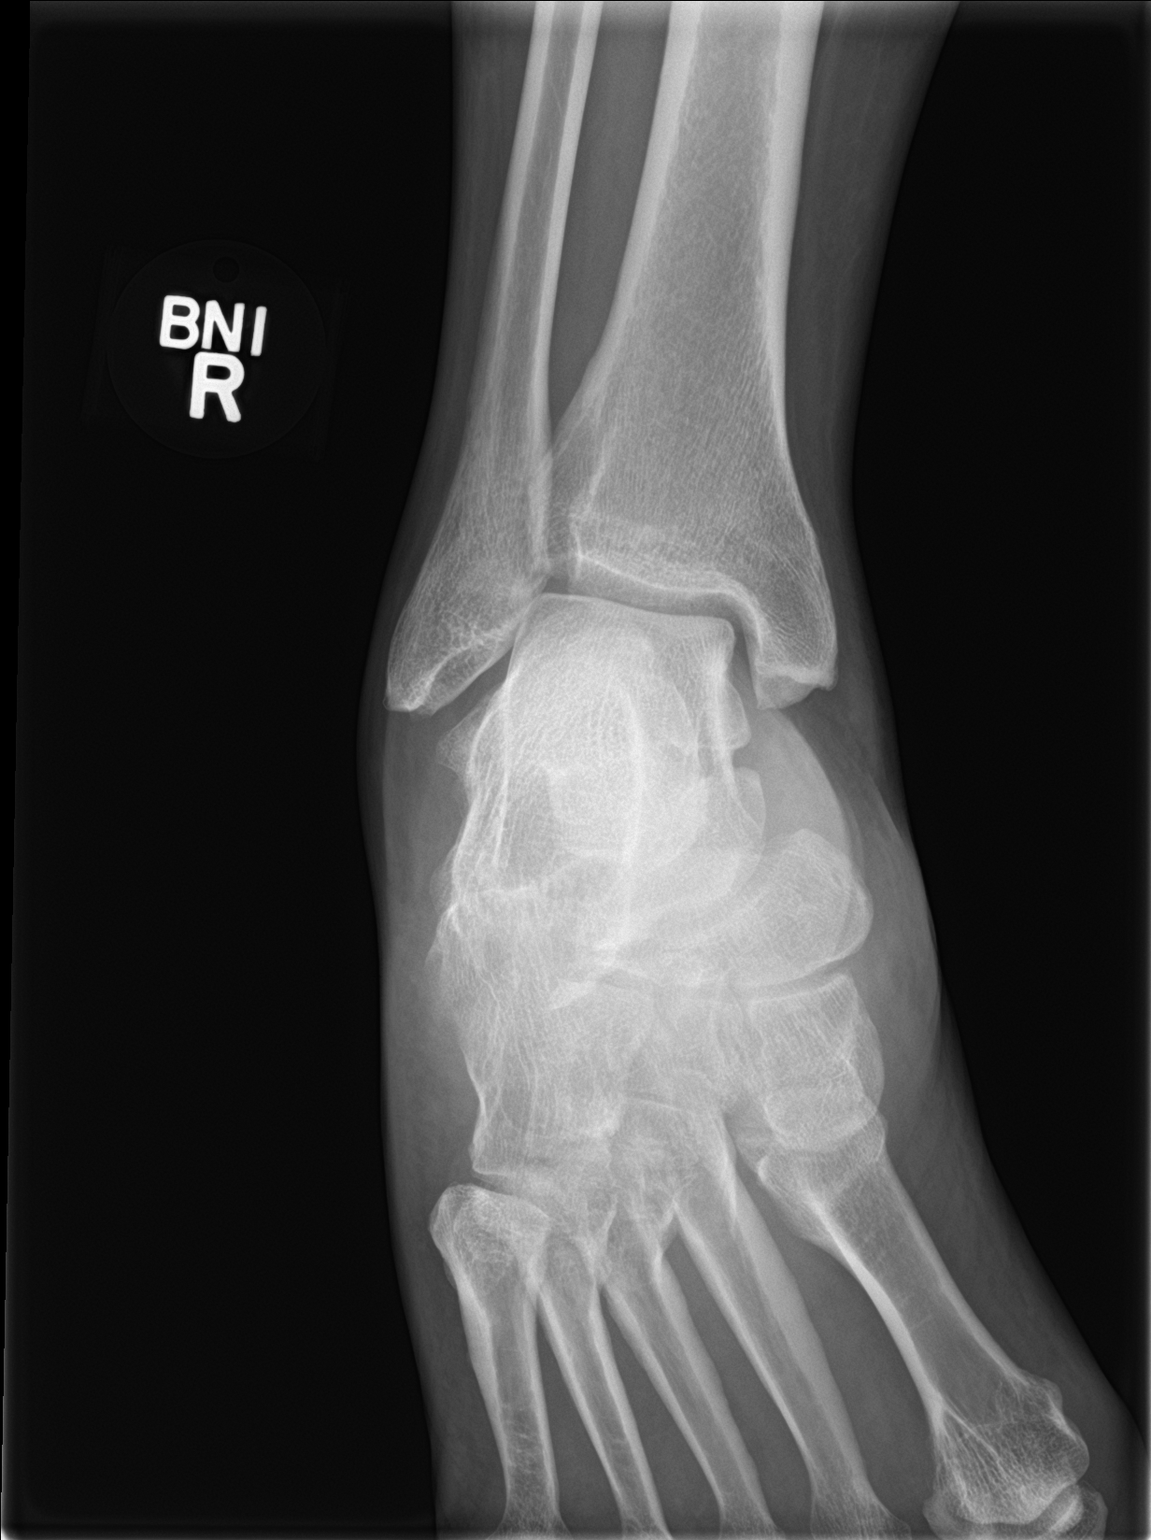

[ankle obl]
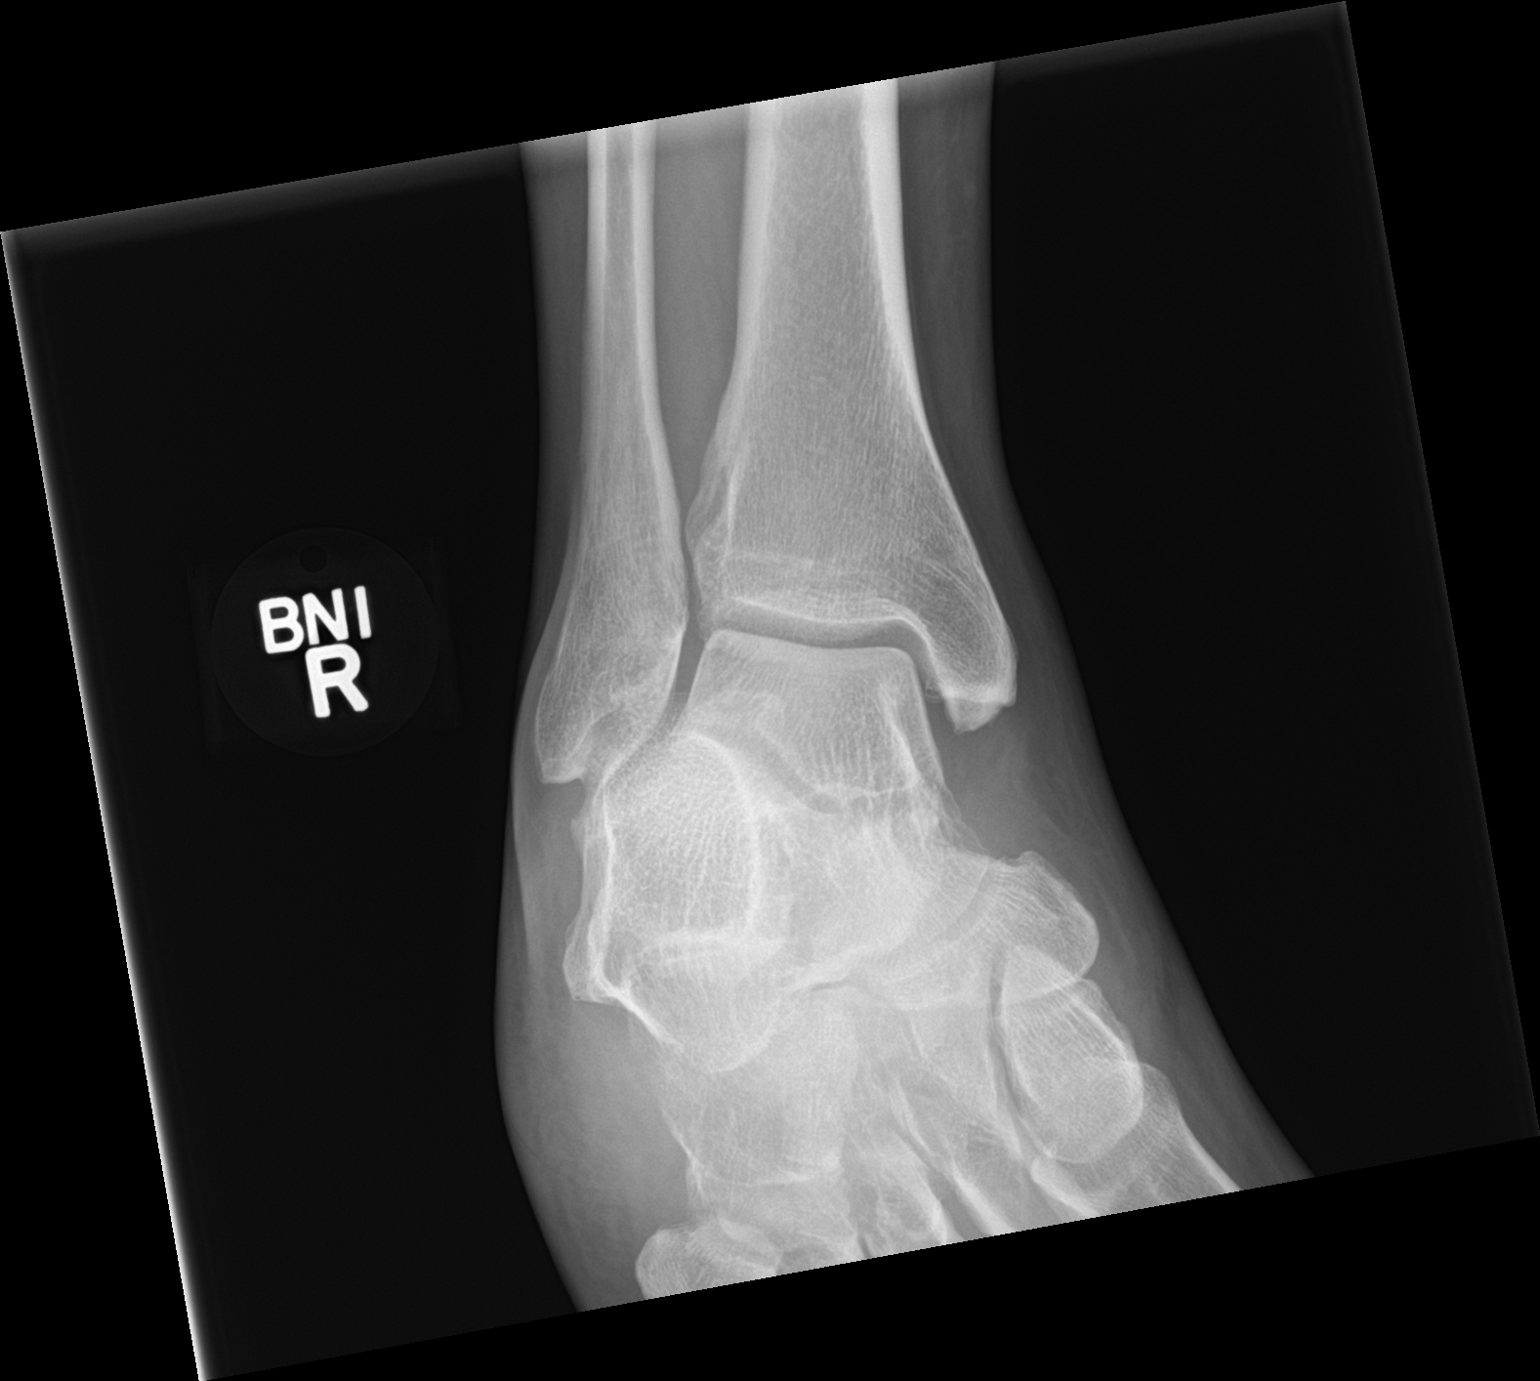

[ankle lat]
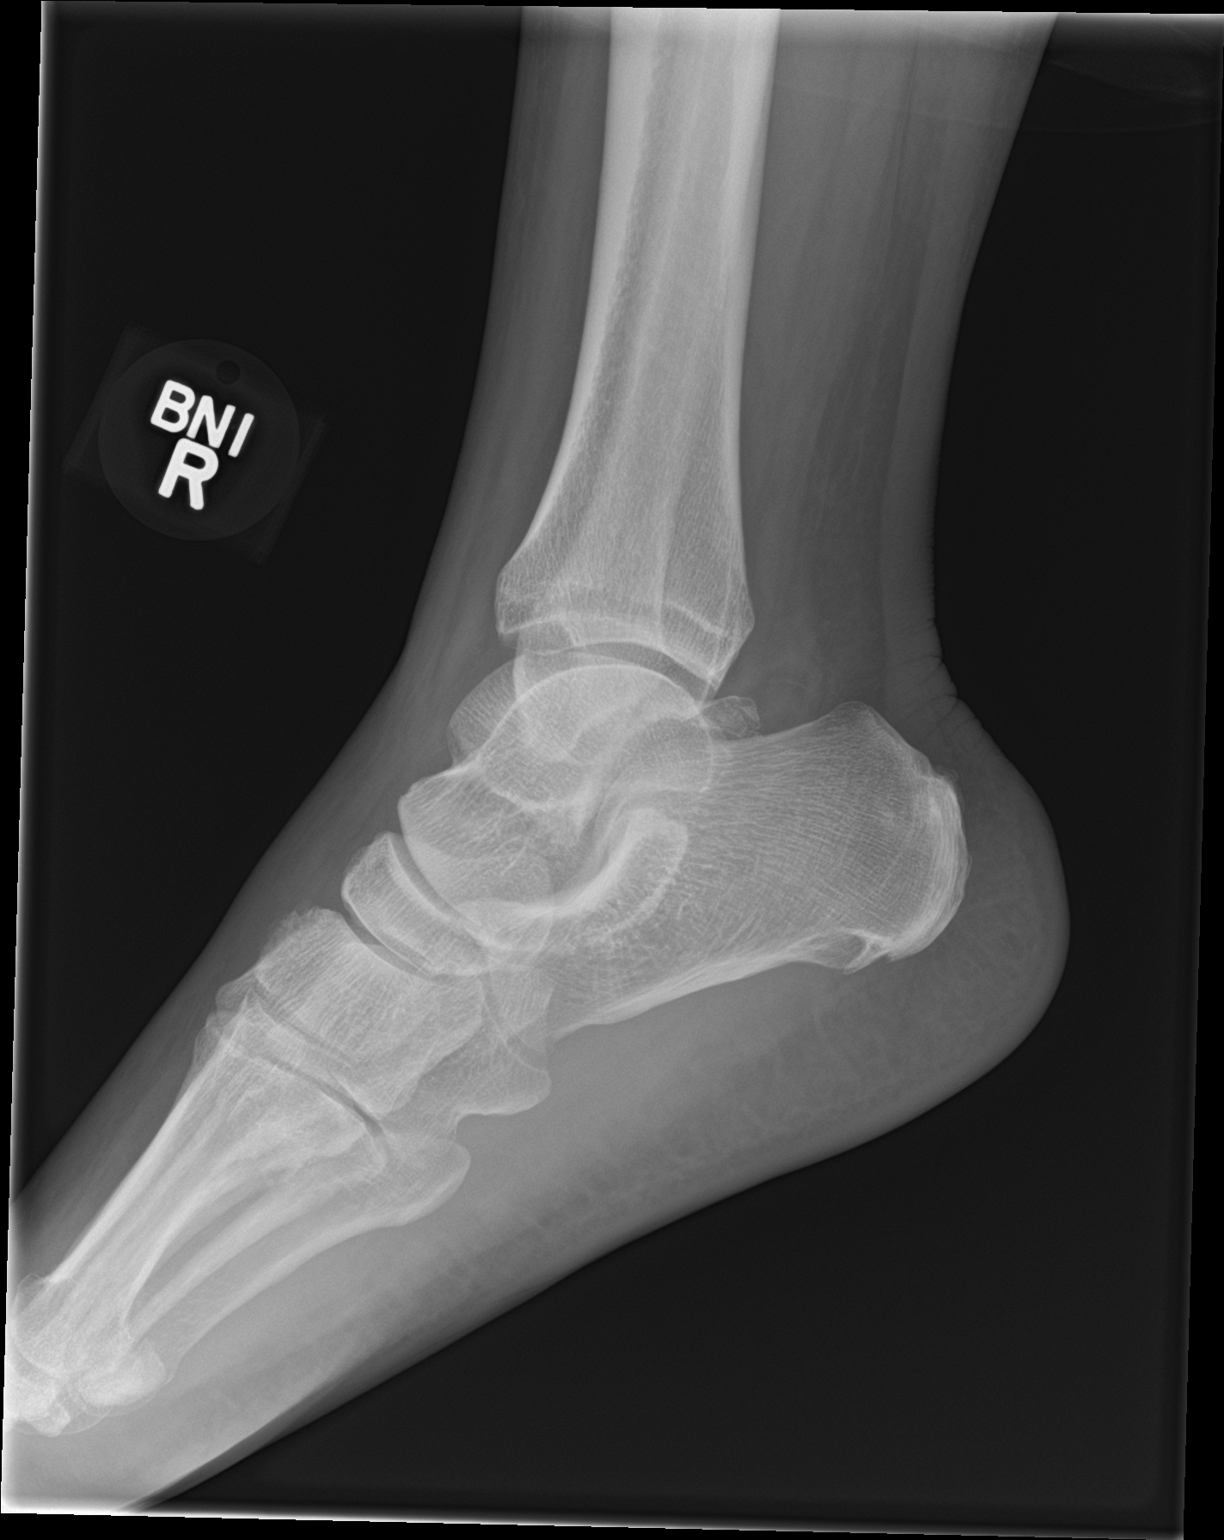

[3 of 3 positions shown; findings below may reference images not displayed]

FINDINGS: There is no evidence of fracture, dislocation, or joint effusion.
There is no evidence of arthropathy or other focal bone abnormality.
Pes planus. Soft tissues are unremarkable.
IMPRESSION: No acute findings.

## 2020-04-27 DIAGNOSIS — R5383 Other fatigue: Secondary | ICD-10-CM | POA: Diagnosis not present

## 2020-04-27 DIAGNOSIS — E782 Mixed hyperlipidemia: Secondary | ICD-10-CM | POA: Diagnosis not present

## 2020-04-27 DIAGNOSIS — R6889 Other general symptoms and signs: Secondary | ICD-10-CM | POA: Diagnosis not present

## 2020-04-27 DIAGNOSIS — I10 Essential (primary) hypertension: Secondary | ICD-10-CM | POA: Diagnosis not present

## 2020-05-04 DIAGNOSIS — R6889 Other general symptoms and signs: Secondary | ICD-10-CM | POA: Diagnosis not present

## 2020-05-04 DIAGNOSIS — R5383 Other fatigue: Secondary | ICD-10-CM | POA: Diagnosis not present

## 2020-05-04 DIAGNOSIS — H547 Unspecified visual loss: Secondary | ICD-10-CM | POA: Diagnosis not present

## 2020-05-04 DIAGNOSIS — I10 Essential (primary) hypertension: Secondary | ICD-10-CM | POA: Diagnosis not present

## 2020-05-04 DIAGNOSIS — F322 Major depressive disorder, single episode, severe without psychotic features: Secondary | ICD-10-CM | POA: Diagnosis not present

## 2020-05-04 DIAGNOSIS — J4540 Moderate persistent asthma, uncomplicated: Secondary | ICD-10-CM | POA: Diagnosis not present

## 2020-05-04 DIAGNOSIS — E782 Mixed hyperlipidemia: Secondary | ICD-10-CM | POA: Diagnosis not present

## 2020-05-04 DIAGNOSIS — Z0000 Encounter for general adult medical examination without abnormal findings: Secondary | ICD-10-CM | POA: Diagnosis not present

## 2020-06-24 DIAGNOSIS — Z23 Encounter for immunization: Secondary | ICD-10-CM | POA: Diagnosis not present

## 2020-06-24 DIAGNOSIS — R6889 Other general symptoms and signs: Secondary | ICD-10-CM | POA: Diagnosis not present

## 2021-07-25 DIAGNOSIS — I10 Essential (primary) hypertension: Secondary | ICD-10-CM | POA: Diagnosis not present

## 2021-07-25 DIAGNOSIS — R5383 Other fatigue: Secondary | ICD-10-CM | POA: Diagnosis not present

## 2021-07-25 DIAGNOSIS — E782 Mixed hyperlipidemia: Secondary | ICD-10-CM | POA: Diagnosis not present

## 2021-08-01 DIAGNOSIS — J4540 Moderate persistent asthma, uncomplicated: Secondary | ICD-10-CM | POA: Diagnosis not present

## 2021-08-01 DIAGNOSIS — H547 Unspecified visual loss: Secondary | ICD-10-CM | POA: Diagnosis not present

## 2021-08-01 DIAGNOSIS — Z0000 Encounter for general adult medical examination without abnormal findings: Secondary | ICD-10-CM | POA: Diagnosis not present

## 2021-08-01 DIAGNOSIS — R5383 Other fatigue: Secondary | ICD-10-CM | POA: Diagnosis not present

## 2021-08-01 DIAGNOSIS — E782 Mixed hyperlipidemia: Secondary | ICD-10-CM | POA: Diagnosis not present

## 2021-08-01 DIAGNOSIS — M4317 Spondylolisthesis, lumbosacral region: Secondary | ICD-10-CM | POA: Diagnosis not present

## 2021-08-01 DIAGNOSIS — K2100 Gastro-esophageal reflux disease with esophagitis, without bleeding: Secondary | ICD-10-CM | POA: Diagnosis not present

## 2021-08-01 DIAGNOSIS — I10 Essential (primary) hypertension: Secondary | ICD-10-CM | POA: Diagnosis not present

## 2021-08-01 DIAGNOSIS — N182 Chronic kidney disease, stage 2 (mild): Secondary | ICD-10-CM | POA: Diagnosis not present

## 2021-08-31 DIAGNOSIS — Z1211 Encounter for screening for malignant neoplasm of colon: Secondary | ICD-10-CM | POA: Diagnosis not present

## 2021-08-31 DIAGNOSIS — Z1212 Encounter for screening for malignant neoplasm of rectum: Secondary | ICD-10-CM | POA: Diagnosis not present

## 2021-10-03 DIAGNOSIS — M4317 Spondylolisthesis, lumbosacral region: Secondary | ICD-10-CM | POA: Diagnosis not present

## 2021-10-03 DIAGNOSIS — M5459 Other low back pain: Secondary | ICD-10-CM | POA: Diagnosis not present

## 2021-11-08 DIAGNOSIS — N959 Unspecified menopausal and perimenopausal disorder: Secondary | ICD-10-CM | POA: Diagnosis not present

## 2021-11-08 DIAGNOSIS — Z01419 Encounter for gynecological examination (general) (routine) without abnormal findings: Secondary | ICD-10-CM | POA: Diagnosis not present

## 2021-11-08 DIAGNOSIS — Z1151 Encounter for screening for human papillomavirus (HPV): Secondary | ICD-10-CM | POA: Diagnosis not present

## 2021-11-08 DIAGNOSIS — Z1272 Encounter for screening for malignant neoplasm of vagina: Secondary | ICD-10-CM | POA: Diagnosis not present

## 2021-11-08 DIAGNOSIS — Z6832 Body mass index (BMI) 32.0-32.9, adult: Secondary | ICD-10-CM | POA: Diagnosis not present

## 2021-12-16 DIAGNOSIS — Z1231 Encounter for screening mammogram for malignant neoplasm of breast: Secondary | ICD-10-CM | POA: Diagnosis not present

## 2021-12-30 DIAGNOSIS — R928 Other abnormal and inconclusive findings on diagnostic imaging of breast: Secondary | ICD-10-CM | POA: Diagnosis not present

## 2022-01-16 ENCOUNTER — Other Ambulatory Visit (HOSPITAL_COMMUNITY)
Admission: RE | Admit: 2022-01-16 | Discharge: 2022-01-16 | Disposition: A | Discharge status: Home / Self Care | Payer: Medicare HMO | Source: Ambulatory Visit | Attending: Radiology | Admitting: Radiology

## 2022-01-16 DIAGNOSIS — R590 Localized enlarged lymph nodes: Secondary | ICD-10-CM | POA: Diagnosis not present

## 2022-01-18 LAB — SURGICAL PATHOLOGY

## 2022-02-07 DIAGNOSIS — E782 Mixed hyperlipidemia: Secondary | ICD-10-CM | POA: Diagnosis not present

## 2022-02-07 DIAGNOSIS — D72819 Decreased white blood cell count, unspecified: Secondary | ICD-10-CM | POA: Diagnosis not present

## 2022-02-07 DIAGNOSIS — I10 Essential (primary) hypertension: Secondary | ICD-10-CM | POA: Diagnosis not present

## 2022-02-07 DIAGNOSIS — N182 Chronic kidney disease, stage 2 (mild): Secondary | ICD-10-CM | POA: Diagnosis not present

## 2022-02-13 DIAGNOSIS — I10 Essential (primary) hypertension: Secondary | ICD-10-CM | POA: Diagnosis not present

## 2022-02-13 DIAGNOSIS — F322 Major depressive disorder, single episode, severe without psychotic features: Secondary | ICD-10-CM | POA: Diagnosis not present

## 2022-02-13 DIAGNOSIS — N182 Chronic kidney disease, stage 2 (mild): Secondary | ICD-10-CM | POA: Diagnosis not present

## 2022-02-13 DIAGNOSIS — D72819 Decreased white blood cell count, unspecified: Secondary | ICD-10-CM | POA: Diagnosis not present

## 2022-08-07 DIAGNOSIS — I10 Essential (primary) hypertension: Secondary | ICD-10-CM | POA: Diagnosis not present

## 2022-08-07 DIAGNOSIS — N182 Chronic kidney disease, stage 2 (mild): Secondary | ICD-10-CM | POA: Diagnosis not present

## 2022-08-07 DIAGNOSIS — D72819 Decreased white blood cell count, unspecified: Secondary | ICD-10-CM | POA: Diagnosis not present

## 2022-08-07 DIAGNOSIS — E782 Mixed hyperlipidemia: Secondary | ICD-10-CM | POA: Diagnosis not present

## 2022-08-14 DIAGNOSIS — F322 Major depressive disorder, single episode, severe without psychotic features: Secondary | ICD-10-CM | POA: Diagnosis not present

## 2022-08-14 DIAGNOSIS — Z0000 Encounter for general adult medical examination without abnormal findings: Secondary | ICD-10-CM | POA: Diagnosis not present

## 2022-08-14 DIAGNOSIS — N182 Chronic kidney disease, stage 2 (mild): Secondary | ICD-10-CM | POA: Diagnosis not present

## 2022-08-14 DIAGNOSIS — D72819 Decreased white blood cell count, unspecified: Secondary | ICD-10-CM | POA: Diagnosis not present

## 2023-02-19 DIAGNOSIS — N182 Chronic kidney disease, stage 2 (mild): Secondary | ICD-10-CM | POA: Diagnosis not present

## 2023-02-19 DIAGNOSIS — M4317 Spondylolisthesis, lumbosacral region: Secondary | ICD-10-CM | POA: Diagnosis not present

## 2023-02-19 DIAGNOSIS — D72819 Decreased white blood cell count, unspecified: Secondary | ICD-10-CM | POA: Diagnosis not present

## 2023-02-19 DIAGNOSIS — R5383 Other fatigue: Secondary | ICD-10-CM | POA: Diagnosis not present

## 2023-02-19 DIAGNOSIS — E782 Mixed hyperlipidemia: Secondary | ICD-10-CM | POA: Diagnosis not present

## 2023-02-19 DIAGNOSIS — K2100 Gastro-esophageal reflux disease with esophagitis, without bleeding: Secondary | ICD-10-CM | POA: Diagnosis not present

## 2023-02-19 DIAGNOSIS — I10 Essential (primary) hypertension: Secondary | ICD-10-CM | POA: Diagnosis not present

## 2023-02-19 DIAGNOSIS — H547 Unspecified visual loss: Secondary | ICD-10-CM | POA: Diagnosis not present

## 2023-02-19 DIAGNOSIS — F322 Major depressive disorder, single episode, severe without psychotic features: Secondary | ICD-10-CM | POA: Diagnosis not present

## 2023-02-19 DIAGNOSIS — J4540 Moderate persistent asthma, uncomplicated: Secondary | ICD-10-CM | POA: Diagnosis not present

## 2023-02-26 DIAGNOSIS — E782 Mixed hyperlipidemia: Secondary | ICD-10-CM | POA: Diagnosis not present

## 2023-02-26 DIAGNOSIS — R5383 Other fatigue: Secondary | ICD-10-CM | POA: Diagnosis not present

## 2023-02-26 DIAGNOSIS — N182 Chronic kidney disease, stage 2 (mild): Secondary | ICD-10-CM | POA: Diagnosis not present

## 2023-02-26 DIAGNOSIS — F322 Major depressive disorder, single episode, severe without psychotic features: Secondary | ICD-10-CM | POA: Diagnosis not present

## 2023-02-26 DIAGNOSIS — J4540 Moderate persistent asthma, uncomplicated: Secondary | ICD-10-CM | POA: Diagnosis not present

## 2023-02-26 DIAGNOSIS — M4317 Spondylolisthesis, lumbosacral region: Secondary | ICD-10-CM | POA: Diagnosis not present

## 2023-02-26 DIAGNOSIS — I10 Essential (primary) hypertension: Secondary | ICD-10-CM | POA: Diagnosis not present

## 2023-02-26 DIAGNOSIS — H547 Unspecified visual loss: Secondary | ICD-10-CM | POA: Diagnosis not present

## 2023-02-26 DIAGNOSIS — D72819 Decreased white blood cell count, unspecified: Secondary | ICD-10-CM | POA: Diagnosis not present

## 2023-02-26 DIAGNOSIS — K2100 Gastro-esophageal reflux disease with esophagitis, without bleeding: Secondary | ICD-10-CM | POA: Diagnosis not present

## 2023-09-10 DIAGNOSIS — E782 Mixed hyperlipidemia: Secondary | ICD-10-CM | POA: Diagnosis not present

## 2023-09-10 DIAGNOSIS — I10 Essential (primary) hypertension: Secondary | ICD-10-CM | POA: Diagnosis not present

## 2023-09-10 DIAGNOSIS — D72819 Decreased white blood cell count, unspecified: Secondary | ICD-10-CM | POA: Diagnosis not present

## 2023-09-10 DIAGNOSIS — N182 Chronic kidney disease, stage 2 (mild): Secondary | ICD-10-CM | POA: Diagnosis not present

## 2023-09-10 DIAGNOSIS — R5383 Other fatigue: Secondary | ICD-10-CM | POA: Diagnosis not present

## 2023-09-17 DIAGNOSIS — D72819 Decreased white blood cell count, unspecified: Secondary | ICD-10-CM | POA: Diagnosis not present

## 2023-09-17 DIAGNOSIS — E782 Mixed hyperlipidemia: Secondary | ICD-10-CM | POA: Diagnosis not present

## 2023-09-17 DIAGNOSIS — M4317 Spondylolisthesis, lumbosacral region: Secondary | ICD-10-CM | POA: Diagnosis not present

## 2023-09-17 DIAGNOSIS — I10 Essential (primary) hypertension: Secondary | ICD-10-CM | POA: Diagnosis not present

## 2023-09-17 DIAGNOSIS — F322 Major depressive disorder, single episode, severe without psychotic features: Secondary | ICD-10-CM | POA: Diagnosis not present

## 2023-09-17 DIAGNOSIS — J4540 Moderate persistent asthma, uncomplicated: Secondary | ICD-10-CM | POA: Diagnosis not present

## 2023-09-17 DIAGNOSIS — R5383 Other fatigue: Secondary | ICD-10-CM | POA: Diagnosis not present

## 2023-09-17 DIAGNOSIS — Z0000 Encounter for general adult medical examination without abnormal findings: Secondary | ICD-10-CM | POA: Diagnosis not present

## 2023-09-17 DIAGNOSIS — K2100 Gastro-esophageal reflux disease with esophagitis, without bleeding: Secondary | ICD-10-CM | POA: Diagnosis not present

## 2023-09-17 DIAGNOSIS — N182 Chronic kidney disease, stage 2 (mild): Secondary | ICD-10-CM | POA: Diagnosis not present

## 2024-01-04 DIAGNOSIS — Z6834 Body mass index (BMI) 34.0-34.9, adult: Secondary | ICD-10-CM | POA: Diagnosis not present

## 2024-01-04 DIAGNOSIS — Z01419 Encounter for gynecological examination (general) (routine) without abnormal findings: Secondary | ICD-10-CM | POA: Diagnosis not present

## 2024-01-04 DIAGNOSIS — N959 Unspecified menopausal and perimenopausal disorder: Secondary | ICD-10-CM | POA: Diagnosis not present

## 2024-01-04 DIAGNOSIS — Z1231 Encounter for screening mammogram for malignant neoplasm of breast: Secondary | ICD-10-CM | POA: Diagnosis not present

## 2024-03-10 DIAGNOSIS — N182 Chronic kidney disease, stage 2 (mild): Secondary | ICD-10-CM | POA: Diagnosis not present

## 2024-03-10 DIAGNOSIS — I10 Essential (primary) hypertension: Secondary | ICD-10-CM | POA: Diagnosis not present

## 2024-03-10 DIAGNOSIS — E782 Mixed hyperlipidemia: Secondary | ICD-10-CM | POA: Diagnosis not present

## 2024-03-10 DIAGNOSIS — F322 Major depressive disorder, single episode, severe without psychotic features: Secondary | ICD-10-CM | POA: Diagnosis not present

## 2024-03-17 DIAGNOSIS — E782 Mixed hyperlipidemia: Secondary | ICD-10-CM | POA: Diagnosis not present

## 2024-03-17 DIAGNOSIS — D72819 Decreased white blood cell count, unspecified: Secondary | ICD-10-CM | POA: Diagnosis not present

## 2024-03-17 DIAGNOSIS — H547 Unspecified visual loss: Secondary | ICD-10-CM | POA: Diagnosis not present

## 2024-03-17 DIAGNOSIS — K2100 Gastro-esophageal reflux disease with esophagitis, without bleeding: Secondary | ICD-10-CM | POA: Diagnosis not present

## 2024-03-17 DIAGNOSIS — F322 Major depressive disorder, single episode, severe without psychotic features: Secondary | ICD-10-CM | POA: Diagnosis not present

## 2024-03-17 DIAGNOSIS — M4317 Spondylolisthesis, lumbosacral region: Secondary | ICD-10-CM | POA: Diagnosis not present

## 2024-03-17 DIAGNOSIS — R5383 Other fatigue: Secondary | ICD-10-CM | POA: Diagnosis not present

## 2024-03-17 DIAGNOSIS — J4540 Moderate persistent asthma, uncomplicated: Secondary | ICD-10-CM | POA: Diagnosis not present

## 2024-03-17 DIAGNOSIS — I10 Essential (primary) hypertension: Secondary | ICD-10-CM | POA: Diagnosis not present

## 2024-03-17 DIAGNOSIS — N182 Chronic kidney disease, stage 2 (mild): Secondary | ICD-10-CM | POA: Diagnosis not present

## 2024-05-14 DIAGNOSIS — F32A Depression, unspecified: Secondary | ICD-10-CM | POA: Diagnosis not present

## 2024-05-14 DIAGNOSIS — G932 Benign intracranial hypertension: Secondary | ICD-10-CM | POA: Diagnosis not present

## 2024-05-14 DIAGNOSIS — Z008 Encounter for other general examination: Secondary | ICD-10-CM | POA: Diagnosis not present

## 2024-05-14 DIAGNOSIS — E785 Hyperlipidemia, unspecified: Secondary | ICD-10-CM | POA: Diagnosis not present

## 2024-05-14 DIAGNOSIS — J45909 Unspecified asthma, uncomplicated: Secondary | ICD-10-CM | POA: Diagnosis not present

## 2024-05-14 DIAGNOSIS — R269 Unspecified abnormalities of gait and mobility: Secondary | ICD-10-CM | POA: Diagnosis not present

## 2024-05-14 DIAGNOSIS — Z6836 Body mass index (BMI) 36.0-36.9, adult: Secondary | ICD-10-CM | POA: Diagnosis not present
